# Patient Record
Sex: Male | Born: 1965 | Race: White | Hispanic: No | Marital: Married | Smoking: Never smoker
Health system: Southern US, Community
[De-identification: ages and names within clinical notes are randomized; demographics above are authoritative.]

## PROBLEM LIST (undated history)

## (undated) DIAGNOSIS — E119 Type 2 diabetes mellitus without complications: Secondary | ICD-10-CM

## (undated) DIAGNOSIS — E785 Hyperlipidemia, unspecified: Secondary | ICD-10-CM

## (undated) DIAGNOSIS — I10 Essential (primary) hypertension: Secondary | ICD-10-CM

## (undated) HISTORY — DX: Essential (primary) hypertension: I10

## (undated) HISTORY — DX: Type 2 diabetes mellitus without complications: E11.9

## (undated) HISTORY — DX: Hyperlipidemia, unspecified: E78.5

---
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
## 1989-07-26 HISTORY — PX: COLONOSCOPY: SHX174

## 2016-06-24 DIAGNOSIS — Z0000 Encounter for general adult medical examination without abnormal findings: Secondary | ICD-10-CM | POA: Diagnosis not present

## 2016-06-24 DIAGNOSIS — Z23 Encounter for immunization: Secondary | ICD-10-CM | POA: Diagnosis not present

## 2016-06-24 DIAGNOSIS — Z6841 Body Mass Index (BMI) 40.0 and over, adult: Secondary | ICD-10-CM | POA: Diagnosis not present

## 2016-06-24 DIAGNOSIS — Z1211 Encounter for screening for malignant neoplasm of colon: Secondary | ICD-10-CM | POA: Diagnosis not present

## 2016-09-14 DIAGNOSIS — D3610 Benign neoplasm of peripheral nerves and autonomic nervous system, unspecified: Secondary | ICD-10-CM | POA: Diagnosis not present

## 2016-09-14 DIAGNOSIS — D485 Neoplasm of uncertain behavior of skin: Secondary | ICD-10-CM | POA: Diagnosis not present

## 2017-01-17 DIAGNOSIS — L723 Sebaceous cyst: Secondary | ICD-10-CM | POA: Diagnosis not present

## 2017-01-17 DIAGNOSIS — L03312 Cellulitis of back [any part except buttock]: Secondary | ICD-10-CM | POA: Diagnosis not present

## 2017-01-17 DIAGNOSIS — R030 Elevated blood-pressure reading, without diagnosis of hypertension: Secondary | ICD-10-CM | POA: Diagnosis not present

## 2017-01-27 DIAGNOSIS — I10 Essential (primary) hypertension: Secondary | ICD-10-CM | POA: Diagnosis not present

## 2017-01-27 DIAGNOSIS — L723 Sebaceous cyst: Secondary | ICD-10-CM | POA: Diagnosis not present

## 2017-02-02 DIAGNOSIS — L720 Epidermal cyst: Secondary | ICD-10-CM | POA: Diagnosis not present

## 2017-02-05 DIAGNOSIS — L720 Epidermal cyst: Secondary | ICD-10-CM | POA: Diagnosis not present

## 2017-02-10 DIAGNOSIS — E119 Type 2 diabetes mellitus without complications: Secondary | ICD-10-CM | POA: Diagnosis not present

## 2017-02-10 DIAGNOSIS — R945 Abnormal results of liver function studies: Secondary | ICD-10-CM | POA: Diagnosis not present

## 2017-02-10 DIAGNOSIS — R739 Hyperglycemia, unspecified: Secondary | ICD-10-CM | POA: Diagnosis not present

## 2017-02-10 DIAGNOSIS — I10 Essential (primary) hypertension: Secondary | ICD-10-CM | POA: Diagnosis not present

## 2017-02-11 DIAGNOSIS — Z4802 Encounter for removal of sutures: Secondary | ICD-10-CM | POA: Diagnosis not present

## 2017-02-24 DIAGNOSIS — E7800 Pure hypercholesterolemia, unspecified: Secondary | ICD-10-CM | POA: Diagnosis not present

## 2017-02-24 DIAGNOSIS — I10 Essential (primary) hypertension: Secondary | ICD-10-CM | POA: Diagnosis not present

## 2017-02-24 DIAGNOSIS — E119 Type 2 diabetes mellitus without complications: Secondary | ICD-10-CM | POA: Diagnosis not present

## 2017-02-24 DIAGNOSIS — R945 Abnormal results of liver function studies: Secondary | ICD-10-CM | POA: Diagnosis not present

## 2017-03-10 DIAGNOSIS — E119 Type 2 diabetes mellitus without complications: Secondary | ICD-10-CM | POA: Diagnosis not present

## 2017-03-10 DIAGNOSIS — R05 Cough: Secondary | ICD-10-CM | POA: Diagnosis not present

## 2017-03-10 DIAGNOSIS — I10 Essential (primary) hypertension: Secondary | ICD-10-CM | POA: Diagnosis not present

## 2017-03-10 DIAGNOSIS — R7989 Other specified abnormal findings of blood chemistry: Secondary | ICD-10-CM | POA: Diagnosis not present

## 2017-05-09 DIAGNOSIS — I10 Essential (primary) hypertension: Secondary | ICD-10-CM | POA: Diagnosis not present

## 2017-05-09 DIAGNOSIS — R7989 Other specified abnormal findings of blood chemistry: Secondary | ICD-10-CM | POA: Diagnosis not present

## 2017-05-09 DIAGNOSIS — E119 Type 2 diabetes mellitus without complications: Secondary | ICD-10-CM | POA: Diagnosis not present

## 2017-06-15 DIAGNOSIS — L0390 Cellulitis, unspecified: Secondary | ICD-10-CM | POA: Diagnosis not present

## 2017-06-15 DIAGNOSIS — I10 Essential (primary) hypertension: Secondary | ICD-10-CM | POA: Diagnosis not present

## 2017-06-15 DIAGNOSIS — E119 Type 2 diabetes mellitus without complications: Secondary | ICD-10-CM | POA: Diagnosis not present

## 2017-06-15 DIAGNOSIS — E7800 Pure hypercholesterolemia, unspecified: Secondary | ICD-10-CM | POA: Diagnosis not present

## 2017-06-20 DIAGNOSIS — L720 Epidermal cyst: Secondary | ICD-10-CM | POA: Diagnosis not present

## 2017-06-21 DIAGNOSIS — E0510 Thyrotoxicosis with toxic single thyroid nodule without thyrotoxic crisis or storm: Secondary | ICD-10-CM | POA: Diagnosis not present

## 2017-06-30 DIAGNOSIS — E7800 Pure hypercholesterolemia, unspecified: Secondary | ICD-10-CM | POA: Diagnosis not present

## 2017-06-30 DIAGNOSIS — I10 Essential (primary) hypertension: Secondary | ICD-10-CM | POA: Diagnosis not present

## 2017-06-30 DIAGNOSIS — E119 Type 2 diabetes mellitus without complications: Secondary | ICD-10-CM | POA: Diagnosis not present

## 2017-08-10 DIAGNOSIS — R7989 Other specified abnormal findings of blood chemistry: Secondary | ICD-10-CM | POA: Diagnosis not present

## 2017-08-10 DIAGNOSIS — I10 Essential (primary) hypertension: Secondary | ICD-10-CM | POA: Diagnosis not present

## 2017-08-10 DIAGNOSIS — E119 Type 2 diabetes mellitus without complications: Secondary | ICD-10-CM | POA: Diagnosis not present

## 2017-08-10 DIAGNOSIS — E782 Mixed hyperlipidemia: Secondary | ICD-10-CM | POA: Diagnosis not present

## 2017-10-13 DIAGNOSIS — R718 Other abnormality of red blood cells: Secondary | ICD-10-CM | POA: Diagnosis not present

## 2017-10-13 DIAGNOSIS — E782 Mixed hyperlipidemia: Secondary | ICD-10-CM | POA: Diagnosis not present

## 2017-10-13 DIAGNOSIS — E119 Type 2 diabetes mellitus without complications: Secondary | ICD-10-CM | POA: Diagnosis not present

## 2017-10-13 DIAGNOSIS — I10 Essential (primary) hypertension: Secondary | ICD-10-CM | POA: Diagnosis not present

## 2017-12-05 ENCOUNTER — Ambulatory Visit: Payer: BLUE CROSS/BLUE SHIELD | Admitting: Family Medicine

## 2017-12-05 ENCOUNTER — Encounter: Payer: Self-pay | Admitting: Family Medicine

## 2017-12-05 VITALS — BP 134/86 | HR 80 | Temp 98.5°F | Ht 68.0 in | Wt 349.0 lb

## 2017-12-05 DIAGNOSIS — Z125 Encounter for screening for malignant neoplasm of prostate: Secondary | ICD-10-CM

## 2017-12-05 DIAGNOSIS — Z1211 Encounter for screening for malignant neoplasm of colon: Secondary | ICD-10-CM | POA: Diagnosis not present

## 2017-12-05 DIAGNOSIS — Z0000 Encounter for general adult medical examination without abnormal findings: Secondary | ICD-10-CM

## 2017-12-05 DIAGNOSIS — Z23 Encounter for immunization: Secondary | ICD-10-CM

## 2017-12-05 DIAGNOSIS — Z114 Encounter for screening for human immunodeficiency virus [HIV]: Secondary | ICD-10-CM | POA: Diagnosis not present

## 2017-12-05 LAB — COMPREHENSIVE METABOLIC PANEL
ALBUMIN: 4.3 g/dL (ref 3.5–5.2)
ALK PHOS: 49 U/L (ref 39–117)
ALT: 48 U/L (ref 0–53)
AST: 22 U/L (ref 0–37)
BILIRUBIN TOTAL: 0.5 mg/dL (ref 0.2–1.2)
BUN: 13 mg/dL (ref 6–23)
CO2: 33 mEq/L — ABNORMAL HIGH (ref 19–32)
Calcium: 10.2 mg/dL (ref 8.4–10.5)
Chloride: 100 mEq/L (ref 96–112)
Creatinine, Ser: 0.85 mg/dL (ref 0.40–1.50)
GFR: 100.28 mL/min (ref >60.00)
Glucose, Bld: 159 mg/dL — ABNORMAL HIGH (ref 70–99)
POTASSIUM: 4.5 meq/L (ref 3.5–5.1)
SODIUM: 142 meq/L (ref 135–145)
TOTAL PROTEIN: 7.4 g/dL (ref 6.0–8.3)

## 2017-12-05 LAB — LIPID PANEL
CHOLESTEROL: 175 mg/dL (ref 0–200)
HDL: 46.1 mg/dL (ref >39.00)
LDL Cholesterol: 93 mg/dL (ref 0–99)
NonHDL: 129.16
Total CHOL/HDL Ratio: 4
Triglycerides: 181 mg/dL — ABNORMAL HIGH (ref 0.0–149.0)
VLDL: 36.2 mg/dL (ref 0.0–40.0)

## 2017-12-05 LAB — CBC
HCT: 46.4 % (ref 39.0–52.0)
HEMOGLOBIN: 15.5 g/dL (ref 13.0–17.0)
MCHC: 33.4 g/dL (ref 30.0–36.0)
MCV: 81.9 fl (ref 78.0–100.0)
Platelets: 313 10*3/uL (ref 150.0–400.0)
RBC: 5.66 Mil/uL (ref 4.22–5.81)
RDW: 14.1 % (ref 11.5–15.5)
WBC: 8.8 10*3/uL (ref 4.0–10.5)

## 2017-12-05 LAB — PSA: PSA: 1.08 ng/mL (ref 0.10–4.00)

## 2017-12-05 NOTE — Patient Instructions (Signed)
Give [place] 2-3 business days to get the results of your labs back. If labs are normal, you will likely receive a letter in the mail unless you have MyChart. This can take longer than 2-3 business days.   We will await records from your previous provider.   If you do not hear anything about your referral in the next 1-2 weeks, call our office and ask for an update.  Aim to do some physical exertion for 150 minutes per week. This is typically divided into 5 days per week, 30 minutes per day. The activity should be enough to get your heart rate up. Anything is better than nothing if you have time constraints.  Keep diet as clean as possible.  Make sure to request the pharmacy of choice when you need a refill.  Call your pharmacy to see about the availability of the new shingles vaccine (Shingrix).  Let us know if you need anything.

## 2017-12-05 NOTE — Progress Notes (Signed)
Chief Complaint  Patient presents with  . Establish Care    Well Male Noah Taylor is here for a complete physical.   His last physical was >1 year ago.  Current diet: in general, a "healthy" diet.  Current exercise: tries walking Weight trend: stable Does pt snore? Yes- no apneic episodes or waking up gasping for air. Daytime fatigue? No. Seat belt? Yes.    Health maintenance Shingrix- No Colonoscopy- No Tetanus- Unsure, may have received in Dec, 2017 HIV- No Prostate cancer screening- No  PCV23- no Last A1c within 3-4 mo, <7   Past Medical History:  Diagnosis Date  . Diabetes mellitus without complication ([place])   . Hyperlipidemia   . Hypertension      History reviewed. No pertinent surgical history.  Medications  Current Outpatient Medications on File Prior to Visit  Medication Sig Dispense Refill  . amLODipine (NORVASC) 2.5 MG tablet Take 2.5 mg by mouth daily.    . hydrochlorothiazide (HYDRODIURIL) 25 MG tablet Take 25 mg by mouth daily.    Marland Kitchen losartan (COZAAR) 50 MG tablet Take 50 mg by mouth daily.    . metFORMIN (GLUCOPHAGE) 500 MG tablet Take by mouth 2 (two) times daily with a meal.    . pravastatin (PRAVACHOL) 20 MG tablet Take 20 mg by mouth daily.     Allergies No Known Allergies  Family History Family History  Problem Relation Age of Onset  . Arthritis Mother   . Cancer Mother        ovarian  . Hyperlipidemia Father   . Hypertension Father   . Cancer Father        lung cancer    Review of Systems: Constitutional:  no fevers Eye:  no recent significant change in vision Ear/Nose/Mouth/Throat:  Ears:  no hearing loss Nose/Mouth/Throat:  no complaints of nasal congestion, no sore throat Cardiovascular:  no chest pain, no palpitations Respiratory:  no cough and no shortness of breath Gastrointestinal:  no abdominal pain, no change in bowel habits GU:  Male: negative for dysuria, frequency, and incontinence and negative for prostate  symptoms Musculoskeletal/Extremities:  no pain, redness, or swelling of the joints Integumentary (Skin/Breast):  no abnormal skin lesions reported Neurologic:  no headaches Endocrine: No unexpected weight changes Hematologic/Lymphatic:  no abnormal bleeding  Exam BP 134/86 (BP Location: Left Arm, Patient Position: Sitting, Cuff Size: Large)   Pulse 80   Temp 98.5 F (36.9 C) (Oral)   Ht 5\' 8"  (1.727 m)   Wt (!) 349 lb (158.3 kg)   SpO2 97%   BMI 53.07 kg/m  General:  well developed, well nourished, in no apparent distress Skin:  no significant moles, warts, or growths Head:  no masses, lesions, or tenderness Eyes:  pupils equal and round, sclera anicteric without injection Ears:  canals without lesions, TMs shiny without retraction, no obvious effusion, no erythema Nose:  nares patent, septum midline, mucosa normal Throat/Pharynx:  lips and gingiva without lesion; tongue and uvula midline; non-inflamed pharynx; no exudates or postnasal drainage Neck: neck supple without adenopathy, thyromegaly, or masses Lungs:  clear to auscultation, breath sounds equal bilaterally, no respiratory distress Cardio:  regular rate and rhythm, no LE edema, no bruits Abdomen: Large panniculus, abdomen soft, nontender; bowel sounds normal; no masses or organomegaly Rectal: Deferred Musculoskeletal:  symmetrical muscle groups noted without atrophy or deformity Extremities:  no clubbing, cyanosis, or edema, no deformities, no skin discoloration Neuro:  gait normal; deep tendon reflexes normal and symmetric Psych: well oriented  with normal range of affect and appropriate judgment/insight  Assessment and Plan  Well adult exam - Plan: Lipid panel, Comprehensive metabolic panel, CBC  Screening for HIV (human immunodeficiency virus) - Plan: HIV antibody  Screen for colon cancer - Plan: Ambulatory referral to Gastroenterology  Screening for malignant neoplasm of prostate - Plan: PSA   Well 52 y.o.  male. Counseled on diet and exercise. Counseled on risks and benefits of prostate cancer screening with PSA. The patient agrees to undergo testing. PCV23 today, will await records for tetanus. Contact pharmacy for Shingrix, warned of post 48 hr misery. Immunizations, labs, and further orders as above. Follow up in 6 mo for DM visit pending record review and above results. The patient voiced understanding and agreement to the plan.  [place], DO 12/05/17 9:53 AM

## 2017-12-05 NOTE — Progress Notes (Signed)
Pre visit review using our clinic review tool, if applicable. No additional management support is needed unless otherwise documented below in the visit note. 

## 2017-12-05 NOTE — Addendum Note (Signed)
Addended by: Sharon Seller B on: 12/05/2017 10:03 AM   Modules accepted: Orders

## 2017-12-06 LAB — HIV ANTIBODY (ROUTINE TESTING W REFLEX): HIV 1&2 Ab, 4th Generation: NONREACTIVE

## 2017-12-26 ENCOUNTER — Encounter: Payer: Self-pay | Admitting: Family Medicine

## 2017-12-26 MED ORDER — AMLODIPINE BESYLATE 2.5 MG PO TABS: 2.5000 mg | ORAL_TABLET | Freq: Every day | ORAL | 0 refills | Status: DC

## 2017-12-26 MED ORDER — HYDROCHLOROTHIAZIDE 25 MG PO TABS: 25.0000 mg | ORAL_TABLET | Freq: Every day | ORAL | 0 refills | Status: DC

## 2017-12-26 MED ORDER — METFORMIN HCL 500 MG PO TABS: 500.0000 mg | ORAL_TABLET | Freq: Two times a day (BID) | ORAL | 0 refills | Status: DC

## 2017-12-26 MED ORDER — LOSARTAN POTASSIUM 50 MG PO TABS: 50.0000 mg | ORAL_TABLET | Freq: Every day | ORAL | 0 refills | Status: DC

## 2017-12-26 MED ORDER — PRAVASTATIN SODIUM 20 MG PO TABS: 20.0000 mg | ORAL_TABLET | Freq: Every day | ORAL | 0 refills | Status: DC

## 2018-01-25 ENCOUNTER — Encounter: Payer: Self-pay | Admitting: Gastroenterology

## 2018-02-15 LAB — HM DIABETES EYE EXAM

## 2018-02-20 ENCOUNTER — Encounter: Payer: Self-pay | Admitting: Family Medicine

## 2018-03-07 ENCOUNTER — Telehealth: Payer: Self-pay | Admitting: *Deleted

## 2018-03-07 NOTE — Telephone Encounter (Signed)
Left message that we need to make an OV per Dr Lyndel Safe- left number to return my call

## 2018-03-07 NOTE — Telephone Encounter (Signed)
Let me see this pt  in the office, due to high risk

## 2018-03-07 NOTE — Telephone Encounter (Signed)
Dr Lyndel Safe,  This pt is scheduled for a PV 8-26 and a direct colon 9-9 Monday with you. He had a colon in [place] > 10 yrs ago.  His weight is 349.0lb and he has a BMI of 53.08.  He also has a hx of htn, DM.  Do you want him to have an OV or can he be direct at the hospital   Please advise,  Thanks Lelan Pons

## 2018-03-08 NOTE — Telephone Encounter (Signed)
Patient calling back. Notified patient that Dr.Gupta's first open pv was 9.13.19. Patient states he will call back at later time to schedule.

## 2018-03-28 ENCOUNTER — Other Ambulatory Visit: Payer: Self-pay | Admitting: Family Medicine

## 2018-04-03 ENCOUNTER — Encounter: Payer: BLUE CROSS/BLUE SHIELD | Admitting: Gastroenterology

## 2018-06-05 ENCOUNTER — Encounter: Payer: Self-pay | Admitting: Family Medicine

## 2018-06-05 ENCOUNTER — Ambulatory Visit: Payer: BLUE CROSS/BLUE SHIELD | Admitting: Family Medicine

## 2018-06-05 ENCOUNTER — Other Ambulatory Visit: Payer: Self-pay | Admitting: Family Medicine

## 2018-06-05 VITALS — BP 130/68 | HR 83 | Temp 98.4°F | Ht 68.0 in | Wt 343.2 lb

## 2018-06-05 DIAGNOSIS — E1169 Type 2 diabetes mellitus with other specified complication: Secondary | ICD-10-CM | POA: Insufficient documentation

## 2018-06-05 DIAGNOSIS — E669 Obesity, unspecified: Secondary | ICD-10-CM | POA: Diagnosis not present

## 2018-06-05 DIAGNOSIS — Z23 Encounter for immunization: Secondary | ICD-10-CM | POA: Diagnosis not present

## 2018-06-05 DIAGNOSIS — I10 Essential (primary) hypertension: Secondary | ICD-10-CM | POA: Diagnosis not present

## 2018-06-05 LAB — MICROALBUMIN / CREATININE URINE RATIO
Creatinine,U: 154.1 mg/dL
MICROALB UR: 33.5 mg/dL — AB (ref 0.0–1.9)
Microalb Creat Ratio: 21.7 mg/g (ref 0.0–30.0)

## 2018-06-05 LAB — COMPREHENSIVE METABOLIC PANEL
ALBUMIN: 4.3 g/dL (ref 3.5–5.2)
ALK PHOS: 62 U/L (ref 39–117)
ALT: 78 U/L — ABNORMAL HIGH (ref 0–53)
AST: 33 U/L (ref 0–37)
BUN: 13 mg/dL (ref 6–23)
CALCIUM: 9.5 mg/dL (ref 8.4–10.5)
CHLORIDE: 97 meq/L (ref 96–112)
CO2: 30 mEq/L (ref 19–32)
Creatinine, Ser: 0.88 mg/dL (ref 0.40–1.50)
GFR: 96.16 mL/min (ref >60.00)
Glucose, Bld: 319 mg/dL — ABNORMAL HIGH (ref 70–99)
POTASSIUM: 4.5 meq/L (ref 3.5–5.1)
Sodium: 134 mEq/L — ABNORMAL LOW (ref 135–145)
TOTAL PROTEIN: 7.2 g/dL (ref 6.0–8.3)
Total Bilirubin: 0.5 mg/dL (ref 0.2–1.2)

## 2018-06-05 LAB — LIPID PANEL
CHOL/HDL RATIO: 4
Cholesterol: 190 mg/dL (ref 0–200)
HDL: 44.4 mg/dL (ref >39.00)
LDL CALC: 109 mg/dL — AB (ref 0–99)
NONHDL: 146.02
Triglycerides: 186 mg/dL — ABNORMAL HIGH (ref 0.0–149.0)
VLDL: 37.2 mg/dL (ref 0.0–40.0)

## 2018-06-05 LAB — HEMOGLOBIN A1C: Hgb A1c MFr Bld: 11.1 % — ABNORMAL HIGH (ref 4.6–6.5)

## 2018-06-05 MED ORDER — METFORMIN HCL 1000 MG PO TABS: 1000.0000 mg | ORAL_TABLET | Freq: Two times a day (BID) | ORAL | 3 refills | Status: DC

## 2018-06-05 NOTE — Progress Notes (Signed)
Subjective:   Chief Complaint  Patient presents with  . Follow-up    Noah Taylor is a 52 y.o. male here for follow-up of diabetes.   Noah Taylor's self monitored glucose range is low 100's. Patient denies hypoglycemic reactions. He checks his glucose levels 2 times per week. Patient does not require insulin.   Medications include: Metformin 500 mg bid Exercise: walking Diet: OK Pt is on statin.   Hypertension Patient presents for hypertension follow up. He is compliant with medications- Norvasc, HCTZ, Losartan. Patient has these side effects of medication: none He is sometimes adhering to a healthy diet overall. Exercise: walking  Past Medical History:  Diagnosis Date  . Diabetes mellitus without complication ([place])   . Hyperlipidemia   . Hypertension      Related testing: Date of retinal exam: 01/23/2018 Pneumovax: done Flu Shot: done  Review of Systems: Pulmonary:  No SOB Cardiovascular:  No chest pain  Objective:  BP 130/68 (BP Location: Left Arm, Patient Position: Sitting, Cuff Size: Large)   Pulse 83   Temp 98.4 F (36.9 C) (Oral)   Ht 5\' 8"  (1.727 m)   Wt (!) 343 lb 4 oz (155.7 kg)   SpO2 93%   BMI 52.19 kg/m  General:  Well developed, well nourished, in no apparent distress Skin:  Warm, no pallor or diaphoresis Head:  Normocephalic, atraumatic Eyes:  Pupils equal and round, sclera anicteric without injection  Lungs:  CTAB, no access msc use Cardio:  RRR, no bruits, no LE edema Musculoskeletal:  Symmetrical muscle groups noted without atrophy or deformity Neuro:  Sensation intact to pinprick on feet Psych: Age appropriate judgment and insight  Assessment:   Diabetes mellitus type 2 in obese ([facility]) - Plan: Hemoglobin A1c, Lipid panel, Microalbumin / creatinine urine ratio, Comprehensive metabolic panel, HM DIABETES FOOT EXAM  Essential hypertension  Morbid obesity ([facility])   Plan:   Orders as above. Counseled on diet and exercise. Goal wt loss is 25 lbs in  6 mo. He has lost weight in the past when he sets his mind to it.  Will call GI for colonoscopy after year so his FSA has reset. F/u in 6 mo. The patient voiced understanding and agreement to the plan.  [place], DO 06/05/18 8:13 AM

## 2018-06-05 NOTE — Progress Notes (Signed)
Pre visit review using our clinic review tool, if applicable. No additional management support is needed unless otherwise documented below in the visit note. 

## 2018-06-05 NOTE — Addendum Note (Signed)
Addended by: Sharon Seller B on: 06/05/2018 08:24 AM   Modules accepted: Orders

## 2018-06-05 NOTE — Patient Instructions (Addendum)
Give [place] 2-3 business days to get the results of your labs back.   Goal weight loss: 20-25 lbs.   Aim to do some physical exertion for 150 minutes per week. This is typically divided into 5 days per week, 30 minutes per day. The activity should be enough to get your heart rate up. Anything is better than nothing if you have time constraints.  Clean up the diet.  Let us know if you need anything.

## 2018-06-06 ENCOUNTER — Encounter: Payer: Self-pay | Admitting: Family Medicine

## 2018-07-03 ENCOUNTER — Encounter: Payer: Self-pay | Admitting: Family Medicine

## 2018-07-03 ENCOUNTER — Ambulatory Visit: Payer: BLUE CROSS/BLUE SHIELD | Admitting: Family Medicine

## 2018-07-03 VITALS — BP 122/86 | HR 88 | Temp 98.5°F | Ht 68.5 in | Wt 336.1 lb

## 2018-07-03 DIAGNOSIS — E669 Obesity, unspecified: Secondary | ICD-10-CM | POA: Diagnosis not present

## 2018-07-03 DIAGNOSIS — E1169 Type 2 diabetes mellitus with other specified complication: Secondary | ICD-10-CM | POA: Diagnosis not present

## 2018-07-03 MED ORDER — DAPAGLIFLOZIN PROPANEDIOL 10 MG PO TABS: 10.0000 mg | ORAL_TABLET | Freq: Every day | ORAL | 2 refills | Status: DC

## 2018-07-03 NOTE — Progress Notes (Signed)
Pre visit review using our clinic review tool, if applicable. No additional management support is needed unless otherwise documented below in the visit note. 

## 2018-07-03 NOTE — Patient Instructions (Addendum)
Keep the diet clean and stay active.  Let me know if the medicine is too expensive.   Continue the Metformin and checking your sugars.  There is no data showing that fish oil is helpful with preventing heart disease or living longer.  Let us know if you need anything.

## 2018-07-03 NOTE — Progress Notes (Signed)
Chief Complaint  Patient presents with  . Follow-up    diabetes    Subjective: Patient is a 52 y.o. male here for f/u lab.  Recent A1c was 11.1. He has cleaned up diet tremendously. Walking. Cont to take other meds. Sugars have been in mid-high hundreds. Not on insulin. Metformin was increased from 500 mg bid to 1 g bid.   ROS: Heart: Denies chest pain   Past Medical History:  Diagnosis Date  . Diabetes mellitus without complication ([place])   . Hyperlipidemia   . Hypertension     Objective: BP 122/86 (BP Location: Left Arm, Patient Position: Sitting, Cuff Size: Large)   Pulse 88   Temp 98.5 F (36.9 C) (Oral)   Ht 5' 8.5" (1.74 m)   Wt (!) 336 lb 2 oz (152.5 kg)   SpO2 96%   BMI 50.36 kg/m  General: Awake, appears stated age Heart: RRR, no LE edema Lungs: CTAB, no rales, wheezes or rhonchi. No accessory muscle use Psych: Age appropriate judgment and insight, normal affect and mood  Assessment and Plan: Diabetes mellitus type 2 in obese ([place]) - Plan: dapagliflozin propanediol (FARXIGA) 10 MG TABS tablet  Orders as above. Cont Metformin. Payment card given. Let us know if too expensive. Counseled on diet and exercise, he is doing well. He was offered GLP-1, but preferred PO tab.  F/u in mid Feb. The patient voiced understanding and agreement to the plan.  [place], DO 07/03/18  7:42 AM

## 2018-08-28 ENCOUNTER — Encounter: Payer: Self-pay | Admitting: Family Medicine

## 2018-08-28 DIAGNOSIS — E669 Obesity, unspecified: Principal | ICD-10-CM

## 2018-08-28 DIAGNOSIS — E1169 Type 2 diabetes mellitus with other specified complication: Secondary | ICD-10-CM

## 2018-08-28 MED ORDER — DAPAGLIFLOZIN PROPANEDIOL 10 MG PO TABS: 10.0000 mg | ORAL_TABLET | Freq: Every day | ORAL | 1 refills | Status: DC

## 2018-12-04 ENCOUNTER — Encounter: Payer: Self-pay | Admitting: Family Medicine

## 2018-12-07 ENCOUNTER — Encounter: Payer: BLUE CROSS/BLUE SHIELD | Admitting: Family Medicine

## 2018-12-08 ENCOUNTER — Other Ambulatory Visit: Payer: Self-pay | Admitting: Family Medicine

## 2018-12-08 ENCOUNTER — Encounter: Payer: Self-pay | Admitting: Family Medicine

## 2018-12-08 ENCOUNTER — Other Ambulatory Visit: Payer: Self-pay

## 2018-12-08 ENCOUNTER — Ambulatory Visit (INDEPENDENT_AMBULATORY_CARE_PROVIDER_SITE_OTHER): Payer: BLUE CROSS/BLUE SHIELD | Admitting: Family Medicine

## 2018-12-08 VITALS — BP 108/72 | HR 94 | Temp 98.6°F | Ht 68.0 in | Wt 322.0 lb

## 2018-12-08 DIAGNOSIS — Z0000 Encounter for general adult medical examination without abnormal findings: Secondary | ICD-10-CM | POA: Diagnosis not present

## 2018-12-08 DIAGNOSIS — Z125 Encounter for screening for malignant neoplasm of prostate: Secondary | ICD-10-CM

## 2018-12-08 DIAGNOSIS — E1169 Type 2 diabetes mellitus with other specified complication: Secondary | ICD-10-CM | POA: Diagnosis not present

## 2018-12-08 DIAGNOSIS — R972 Elevated prostate specific antigen [PSA]: Secondary | ICD-10-CM

## 2018-12-08 DIAGNOSIS — E669 Obesity, unspecified: Secondary | ICD-10-CM | POA: Diagnosis not present

## 2018-12-08 DIAGNOSIS — D582 Other hemoglobinopathies: Secondary | ICD-10-CM

## 2018-12-08 LAB — LIPID PANEL
Cholesterol: 177 mg/dL (ref 0–200)
HDL: 41.7 mg/dL (ref >39.00)
LDL Cholesterol: 107 mg/dL — ABNORMAL HIGH (ref 0–99)
NonHDL: 135.2
Total CHOL/HDL Ratio: 4
Triglycerides: 141 mg/dL (ref 0.0–149.0)
VLDL: 28.2 mg/dL (ref 0.0–40.0)

## 2018-12-08 LAB — CBC
HCT: 51.5 % (ref 39.0–52.0)
Hemoglobin: 17.2 g/dL — ABNORMAL HIGH (ref 13.0–17.0)
MCHC: 33.5 g/dL (ref 30.0–36.0)
MCV: 80 fl (ref 78.0–100.0)
Platelets: 332 10*3/uL (ref 150.0–400.0)
RBC: 6.44 Mil/uL — ABNORMAL HIGH (ref 4.22–5.81)
RDW: 14.2 % (ref 11.5–15.5)
WBC: 8.4 10*3/uL (ref 4.0–10.5)

## 2018-12-08 LAB — COMPREHENSIVE METABOLIC PANEL
ALT: 23 U/L (ref 0–53)
AST: 13 U/L (ref 0–37)
Albumin: 4.3 g/dL (ref 3.5–5.2)
Alkaline Phosphatase: 54 U/L (ref 39–117)
BUN: 17 mg/dL (ref 6–23)
CO2: 30 mEq/L (ref 19–32)
Calcium: 9.5 mg/dL (ref 8.4–10.5)
Chloride: 99 mEq/L (ref 96–112)
Creatinine, Ser: 0.96 mg/dL (ref 0.40–1.50)
GFR: 81.67 mL/min (ref >60.00)
Glucose, Bld: 112 mg/dL — ABNORMAL HIGH (ref 70–99)
Potassium: 4 mEq/L (ref 3.5–5.1)
Sodium: 139 mEq/L (ref 135–145)
Total Bilirubin: 0.8 mg/dL (ref 0.2–1.2)
Total Protein: 7.3 g/dL (ref 6.0–8.3)

## 2018-12-08 LAB — PSA: PSA: 1.75 ng/mL (ref 0.10–4.00)

## 2018-12-08 LAB — HEMOGLOBIN A1C: Hgb A1c MFr Bld: 6.4 % (ref 4.6–6.5)

## 2018-12-08 NOTE — Patient Instructions (Signed)
Give [place] 2-3 business days to get the results of your labs back.   Keep the diet clean and stay active.  Schedule your colonoscopy soon.  Let us know if you need anything.

## 2018-12-08 NOTE — Progress Notes (Signed)
Chief Complaint  Patient presents with  . Annual Exam    Well Male Noah Taylor is here for a complete physical.   His last physical was >1 year ago.  Current diet: in general, a "healthy" diet.  Current exercise: walking Weight trend: steadily Daytime fatigue? No. Seat belt? Yes.    Health maintenance Colonoscopy- No - has been referred Tetanus- Yes HIV- Yes Hep C- Yes  Past Medical History:  Diagnosis Date  . Diabetes mellitus without complication ([place])   . Hyperlipidemia   . Hypertension       Past Surgical History:  Procedure Laterality Date  . TONSILLECTOMY AND ADENOIDECTOMY  1969    Medications  Current Outpatient Medications on File Prior to Visit  Medication Sig Dispense Refill  . amLODipine (NORVASC) 2.5 MG tablet TAKE 1 TABLET DAILY 90 tablet 4  . dapagliflozin propanediol (FARXIGA) 10 MG TABS tablet Take 10 mg by mouth daily. 90 tablet 1  . hydrochlorothiazide (HYDRODIURIL) 25 MG tablet TAKE 1 TABLET DAILY 90 tablet 4  . losartan (COZAAR) 50 MG tablet TAKE 1 TABLET DAILY 90 tablet 4  . metFORMIN (GLUCOPHAGE) 1000 MG tablet Take 1 tablet (1,000 mg total) by mouth 2 (two) times daily with a meal. 180 tablet 3  . pravastatin (PRAVACHOL) 20 MG tablet TAKE 1 TABLET DAILY 90 tablet 4   Allergies No Known Allergies  Family History Family History  Problem Relation Age of Onset  . Arthritis Mother   . Cancer Mother        ovarian  . Hyperlipidemia Father   . Hypertension Father   . Cancer Father        lung cancer    Review of Systems: Constitutional:  no fevers Eye:  no recent significant change in vision Ear/Nose/Mouth/Throat:  Ears:  no hearing loss Nose/Mouth/Throat:  no complaints of nasal congestion, no sore throat Cardiovascular:  no chest pain, no palpitations Respiratory:  no cough and no shortness of breath Gastrointestinal:  no abdominal pain, no change in bowel habits GU:  Male: negative for dysuria, frequency, and incontinence and  negative for prostate symptoms Musculoskeletal/Extremities:  no pain, redness, or swelling of the joints Integumentary (Skin/Breast):  no abnormal skin lesions reported Neurologic:  no headaches Endocrine: No unexpected weight changes Hematologic/Lymphatic:  no abnormal bleeding  Exam BP 108/72 (BP Location: Left Arm, Patient Position: Sitting, Cuff Size: Large)   Pulse 94   Temp 98.6 F (37 C) (Oral)   Ht 5\' 8"  (1.727 m)   Wt (!) 322 lb (146.1 kg)   SpO2 96%   BMI 48.96 kg/m  General:  well developed, well nourished, in no apparent distress Skin:  no significant moles, warts, or growths Head:  no masses, lesions, or tenderness Eyes:  pupils equal and round, sclera anicteric without injection Ears:  canals without lesions, TMs shiny without retraction, no obvious effusion, no erythema Nose:  nares patent, septum midline, mucosa normal Throat/Pharynx:  lips and gingiva without lesion; tongue and uvula midline; non-inflamed pharynx; no exudates or postnasal drainage Neck: neck supple without adenopathy, thyromegaly, or masses Lungs:  clear to auscultation, breath sounds equal bilaterally, no respiratory distress Cardio:  regular rate and rhythm, no LE edema, no bruits Abdomen:  abdomen soft, nontender; bowel sounds normal; no masses or organomegaly Rectal: Deferred Musculoskeletal:  symmetrical muscle groups noted without atrophy or deformity Extremities:  no clubbing, cyanosis, or edema, no deformities, no skin discoloration Neuro:  gait normal; deep tendon reflexes normal and symmetric Psych: well  oriented with normal range of affect and appropriate judgment/insight  Assessment and Plan  Well adult exam - Plan: CBC, Comprehensive metabolic panel, Lipid panel  Diabetes mellitus type 2 in obese ([facility]) - Plan: Hemoglobin A1c  Screening for prostate cancer - Plan: PSA   Well 53 y.o. male. Counseled on diet and exercise. Counseled on risks and benefits of prostate cancer  screening with PSA. The patient agrees to undergo testing. Call for colonoscopy, he knows he needs to schedule but due to pandemic had not been able to.  Immunizations, labs, and further orders as above. Follow up in 6 mo pending above. The patient voiced understanding and agreement to the plan.  [place], DO 12/08/18 8:15 AM

## 2019-01-19 ENCOUNTER — Other Ambulatory Visit (INDEPENDENT_AMBULATORY_CARE_PROVIDER_SITE_OTHER): Payer: BC Managed Care – PPO

## 2019-01-19 ENCOUNTER — Other Ambulatory Visit: Payer: Self-pay | Admitting: Family Medicine

## 2019-01-19 ENCOUNTER — Other Ambulatory Visit: Payer: Self-pay

## 2019-01-19 DIAGNOSIS — R972 Elevated prostate specific antigen [PSA]: Secondary | ICD-10-CM | POA: Diagnosis not present

## 2019-01-19 DIAGNOSIS — D582 Other hemoglobinopathies: Secondary | ICD-10-CM | POA: Diagnosis not present

## 2019-01-19 LAB — CBC
HCT: 50.2 % (ref 39.0–52.0)
Hemoglobin: 16.4 g/dL (ref 13.0–17.0)
MCHC: 32.6 g/dL (ref 30.0–36.0)
MCV: 80.9 fl (ref 78.0–100.0)
Platelets: 341 10*3/uL (ref 150.0–400.0)
RBC: 6.2 Mil/uL — ABNORMAL HIGH (ref 4.22–5.81)
RDW: 14.7 % (ref 11.5–15.5)
WBC: 9.2 10*3/uL (ref 4.0–10.5)

## 2019-01-19 LAB — PSA: PSA: 1.24 ng/mL (ref 0.10–4.00)

## 2019-02-06 ENCOUNTER — Encounter: Payer: Self-pay | Admitting: Family Medicine

## 2019-02-06 DIAGNOSIS — E1169 Type 2 diabetes mellitus with other specified complication: Secondary | ICD-10-CM

## 2019-02-06 MED ORDER — DAPAGLIFLOZIN PROPANEDIOL 10 MG PO TABS: 10.0000 mg | ORAL_TABLET | Freq: Every day | ORAL | 1 refills | Status: DC

## 2019-02-16 LAB — HM DIABETES EYE EXAM

## 2019-02-26 ENCOUNTER — Encounter: Payer: Self-pay | Admitting: Family Medicine

## 2019-02-28 ENCOUNTER — Ambulatory Visit (INDEPENDENT_AMBULATORY_CARE_PROVIDER_SITE_OTHER): Payer: BC Managed Care – PPO | Admitting: Family Medicine

## 2019-02-28 ENCOUNTER — Encounter: Payer: Self-pay | Admitting: Family Medicine

## 2019-02-28 ENCOUNTER — Other Ambulatory Visit: Payer: Self-pay

## 2019-02-28 DIAGNOSIS — J0110 Acute frontal sinusitis, unspecified: Secondary | ICD-10-CM

## 2019-02-28 MED ORDER — PREDNISONE 20 MG PO TABS: 40.0000 mg | ORAL_TABLET | Freq: Every day | ORAL | 0 refills | Status: AC

## 2019-02-28 MED ORDER — AMOXICILLIN-POT CLAVULANATE 875-125 MG PO TABS: 1.0000 | ORAL_TABLET | Freq: Two times a day (BID) | ORAL | 0 refills | Status: DC

## 2019-02-28 NOTE — Progress Notes (Signed)
Chief Complaint  Patient presents with  . Sore Throat    congestion  . Fever    Shearon Balo here for URI complaints. Due to COVID-19 pandemic, we are interacting via web portal for an electronic face-to-face visit. I verified patient's ID using 2 identifiers. Patient agreed to proceed with visit via this method. Patient is at home, I am at office. Patient and I are present for visit.   Duration: 2 days  Associated symptoms: sinus headache, sinus congestion, sinus pain, rhinorrhea, sore throat and cough (from drainage) and fever (99.75F) Denies: itchy watery eyes, ear pain, ear drainage, wheezing, shortness of breath, chest pain, myalgia and digestive s/s Treatment to date: Mucinex DM, Motrin Sick contacts: Yes - son  ROS:  Const: +fevers HEENT: As noted in HPI Lungs: No SOB  Past Medical History:  Diagnosis Date  . Diabetes mellitus without complication ([place])   . Hyperlipidemia   . Hypertension    Exam No conversational dyspnea Age appropriate judgment and insight Nml affect and mood  Acute non-recurrent frontal sinusitis - Plan: predniSONE (DELTASONE) 20 MG tablet, amoxicillin-clavulanate (AUGMENTIN) 875-125 MG tablet, pocket rx, take abx in 2 d if no better.  Orders as above. Continue to push fluids, practice good hand hygiene, cover mouth when coughing. F/u prn. If starting to experience fevers, shaking, or shortness of breath, seek immediate care. Pt voiced understanding and agreement to the plan.  [place], DO 02/28/19 3:10 PM

## 2019-03-23 ENCOUNTER — Other Ambulatory Visit: Payer: Self-pay

## 2019-03-23 ENCOUNTER — Other Ambulatory Visit (INDEPENDENT_AMBULATORY_CARE_PROVIDER_SITE_OTHER): Payer: BC Managed Care – PPO

## 2019-03-23 DIAGNOSIS — D582 Other hemoglobinopathies: Secondary | ICD-10-CM

## 2019-03-23 DIAGNOSIS — R972 Elevated prostate specific antigen [PSA]: Secondary | ICD-10-CM

## 2019-03-23 LAB — PSA: PSA: 1.32 ng/mL (ref 0.10–4.00)

## 2019-03-23 LAB — CBC
HCT: 49.7 % (ref 39.0–52.0)
Hemoglobin: 16.6 g/dL (ref 13.0–17.0)
MCHC: 33.3 g/dL (ref 30.0–36.0)
MCV: 80.6 fl (ref 78.0–100.0)
Platelets: 360 10*3/uL (ref 150.0–400.0)
RBC: 6.17 Mil/uL — ABNORMAL HIGH (ref 4.22–5.81)
RDW: 14.3 % (ref 11.5–15.5)
WBC: 8.9 10*3/uL (ref 4.0–10.5)

## 2019-05-24 ENCOUNTER — Other Ambulatory Visit: Payer: Self-pay | Admitting: Family Medicine

## 2019-06-12 ENCOUNTER — Ambulatory Visit: Payer: BLUE CROSS/BLUE SHIELD | Admitting: Family Medicine

## 2019-06-15 ENCOUNTER — Encounter: Payer: Self-pay | Admitting: Family Medicine

## 2019-06-15 ENCOUNTER — Ambulatory Visit: Payer: BC Managed Care – PPO | Admitting: Family Medicine

## 2019-06-15 ENCOUNTER — Other Ambulatory Visit: Payer: Self-pay

## 2019-06-15 VITALS — BP 114/74 | HR 80 | Temp 96.4°F | Ht 68.5 in | Wt 326.4 lb

## 2019-06-15 DIAGNOSIS — Z23 Encounter for immunization: Secondary | ICD-10-CM

## 2019-06-15 DIAGNOSIS — E1169 Type 2 diabetes mellitus with other specified complication: Secondary | ICD-10-CM | POA: Diagnosis not present

## 2019-06-15 DIAGNOSIS — I10 Essential (primary) hypertension: Secondary | ICD-10-CM

## 2019-06-15 DIAGNOSIS — B353 Tinea pedis: Secondary | ICD-10-CM

## 2019-06-15 DIAGNOSIS — E669 Obesity, unspecified: Secondary | ICD-10-CM | POA: Diagnosis not present

## 2019-06-15 LAB — COMPREHENSIVE METABOLIC PANEL
ALT: 33 U/L (ref 0–53)
AST: 17 U/L (ref 0–37)
Albumin: 4.5 g/dL (ref 3.5–5.2)
Alkaline Phosphatase: 55 U/L (ref 39–117)
BUN: 14 mg/dL (ref 6–23)
CO2: 30 mEq/L (ref 19–32)
Calcium: 9.8 mg/dL (ref 8.4–10.5)
Chloride: 97 mEq/L (ref 96–112)
Creatinine, Ser: 0.98 mg/dL (ref 0.40–1.50)
GFR: 79.6 mL/min (ref >60.00)
Glucose, Bld: 115 mg/dL — ABNORMAL HIGH (ref 70–99)
Potassium: 4.4 mEq/L (ref 3.5–5.1)
Sodium: 135 mEq/L (ref 135–145)
Total Bilirubin: 0.8 mg/dL (ref 0.2–1.2)
Total Protein: 7.2 g/dL (ref 6.0–8.3)

## 2019-06-15 LAB — MICROALBUMIN / CREATININE URINE RATIO
Creatinine,U: 97.3 mg/dL
Microalb Creat Ratio: 1.7 mg/g (ref 0.0–30.0)
Microalb, Ur: 1.7 mg/dL (ref 0.0–1.9)

## 2019-06-15 LAB — LIPID PANEL
Cholesterol: 176 mg/dL (ref 0–200)
HDL: 42.7 mg/dL (ref >39.00)
LDL Cholesterol: 100 mg/dL — ABNORMAL HIGH (ref 0–99)
NonHDL: 133.45
Total CHOL/HDL Ratio: 4
Triglycerides: 165 mg/dL — ABNORMAL HIGH (ref 0.0–149.0)
VLDL: 33 mg/dL (ref 0.0–40.0)

## 2019-06-15 LAB — HEMOGLOBIN A1C: Hgb A1c MFr Bld: 6.4 % (ref 4.6–6.5)

## 2019-06-15 MED ORDER — LOSARTAN POTASSIUM 50 MG PO TABS: 50.0000 mg | ORAL_TABLET | Freq: Every day | ORAL | 4 refills | Status: DC

## 2019-06-15 MED ORDER — AMLODIPINE BESYLATE 2.5 MG PO TABS: 2.5000 mg | ORAL_TABLET | Freq: Every day | ORAL | 4 refills | Status: DC

## 2019-06-15 MED ORDER — KETOCONAZOLE 2 % EX CREA: 1.0000 "application " | TOPICAL_CREAM | Freq: Every day | CUTANEOUS | 0 refills | Status: DC

## 2019-06-15 MED ORDER — DAPAGLIFLOZIN PROPANEDIOL 10 MG PO TABS: 10.0000 mg | ORAL_TABLET | Freq: Every day | ORAL | 2 refills | Status: DC

## 2019-06-15 MED ORDER — PRAVASTATIN SODIUM 20 MG PO TABS: 20.0000 mg | ORAL_TABLET | Freq: Every day | ORAL | 4 refills | Status: DC

## 2019-06-15 MED ORDER — HYDROCHLOROTHIAZIDE 25 MG PO TABS: 25.0000 mg | ORAL_TABLET | Freq: Every day | ORAL | 4 refills | Status: DC

## 2019-06-15 NOTE — Patient Instructions (Signed)
Give [place] 2-3 business days to get the results of your labs back.   Keep the feet as dry as possible.  Keep the diet clean and stay active.  Aim to do some physical exertion for 150 minutes per week. This is typically divided into 5 days per week, 30 minutes per day. The activity should be enough to get your heart rate up. Anything is better than nothing if you have time constraints.  Goal weight in 6 mo: 305-310 lbs  Let us know if you need anything.

## 2019-06-15 NOTE — Progress Notes (Signed)
Subjective:   Chief Complaint  Patient presents with  . Follow-up    Noah Taylor is a 53 y.o. male here for follow-up of diabetes.   Lean does not routine Medications include: Metformin 1000 mg bid, Farxiga 10 mg/d Diet is fair, he does enjoy sweets Exercise: none   Hypertension Patient presents for hypertension follow up. He does not monitor home blood pressures. He is compliant with medications.- Norvasc 2.5 mg/d, HCTZ 25 mg/d, losartan 50 mg/d Patient has these side effects of medication: none He is usually adhering to a healthy diet overall. Exercise: none  Past Medical History:  Diagnosis Date  . Diabetes mellitus without complication ([place])   . Hyperlipidemia   . Hypertension      Related testing: Date of retinal exam: Done Pneumovax: done Flu Shot: done  Review of Systems: Pulmonary:  No SOB Cardiovascular:  No chest pain  Objective:  BP 114/74 (BP Location: Left Arm, Patient Position: Sitting, Cuff Size: Large)   Pulse 80   Temp (!) 96.4 F (35.8 C) (Temporal)   Ht 5' 8.5" (1.74 m)   Wt (!) 326 lb 6 oz (148 kg)   SpO2 97%   BMI 48.90 kg/m  General:  Well developed, well nourished, in no apparent distress Skin:  Warm, no pallor or diaphoresis Head:  Normocephalic, atraumatic Eyes:  Pupils equal and round, sclera anicteric without injection  Lungs:  CTAB, no access msc use Cardio:  RRR, no bruits, no LE edema Musculoskeletal:  Symmetrical muscle groups noted without atrophy or deformity Neuro:  Sensation intact to pinprick on feet Psych: Age appropriate judgment and insight  Assessment:   Diabetes mellitus type 2 in obese ([facility]) - Plan: Comp Met (CMET), Lipid Profile, HgB A1c, Urine Microalbumin w/creat. ratio, pravastatin (PRAVACHOL) 20 MG tablet, dapagliflozin propanediol (FARXIGA) 10 MG TABS tablet  Essential hypertension - Plan: amLODipine (NORVASC) 2.5 MG tablet, losartan (COZAAR) 50 MG tablet, hydrochlorothiazide (HYDRODIURIL) 25 MG  tablet  Tinea pedis of both feet - Plan: ketoconazole (NIZORAL) 2 % cream  Need for influenza vaccination - Plan: Flu Vaccine QUAD 6+ mos PF IM (Fluarix Quad PF)   Plan:   Orders as above. Counseled on diet and exercise. F/u in 6 mo. The patient voiced understanding and agreement to the plan.  [place], DO 06/15/19 7:54 AM

## 2019-12-05 ENCOUNTER — Telehealth: Payer: Self-pay | Admitting: Family Medicine

## 2019-12-05 MED ORDER — METFORMIN HCL 1000 MG PO TABS: ORAL_TABLET | ORAL | 1 refills | Status: DC

## 2019-12-05 NOTE — Telephone Encounter (Signed)
° ° °  metFORMIN (GLUCOPHAGE) 1000 MG tablet [id number]   Patient hasn't called pharmacy.   [id number] - [place], [place] - [address] AT [address]  [address], [place] [address]  Phone:  [phone] Fax:  [phone]  DEA #:  [id number]

## 2019-12-05 NOTE — Telephone Encounter (Signed)
Refill done.  

## 2019-12-14 ENCOUNTER — Encounter: Payer: BC Managed Care – PPO | Admitting: Family Medicine

## 2019-12-19 ENCOUNTER — Ambulatory Visit: Payer: BC Managed Care – PPO | Admitting: Family Medicine

## 2020-01-01 ENCOUNTER — Encounter: Payer: Self-pay | Admitting: Family Medicine

## 2020-01-01 ENCOUNTER — Other Ambulatory Visit: Payer: Self-pay | Admitting: Family Medicine

## 2020-01-01 ENCOUNTER — Other Ambulatory Visit: Payer: Self-pay

## 2020-01-01 ENCOUNTER — Ambulatory Visit: Payer: BC Managed Care – PPO | Admitting: Family Medicine

## 2020-01-01 VITALS — BP 122/78 | HR 94 | Temp 95.7°F | Ht 68.5 in | Wt 328.2 lb

## 2020-01-01 DIAGNOSIS — N63 Unspecified lump in unspecified breast: Secondary | ICD-10-CM

## 2020-01-01 DIAGNOSIS — Z0000 Encounter for general adult medical examination without abnormal findings: Secondary | ICD-10-CM | POA: Diagnosis not present

## 2020-01-01 DIAGNOSIS — Z125 Encounter for screening for malignant neoplasm of prostate: Secondary | ICD-10-CM

## 2020-01-01 DIAGNOSIS — Z1211 Encounter for screening for malignant neoplasm of colon: Secondary | ICD-10-CM | POA: Diagnosis not present

## 2020-01-01 DIAGNOSIS — R972 Elevated prostate specific antigen [PSA]: Secondary | ICD-10-CM

## 2020-01-01 LAB — COMPREHENSIVE METABOLIC PANEL
ALT: 33 U/L (ref 0–53)
AST: 17 U/L (ref 0–37)
Albumin: 4.6 g/dL (ref 3.5–5.2)
Alkaline Phosphatase: 56 U/L (ref 39–117)
BUN: 16 mg/dL (ref 6–23)
CO2: 29 mEq/L (ref 19–32)
Calcium: 9.7 mg/dL (ref 8.4–10.5)
Chloride: 98 mEq/L (ref 96–112)
Creatinine, Ser: 0.9 mg/dL (ref 0.40–1.50)
GFR: 87.64 mL/min (ref >60.00)
Glucose, Bld: 102 mg/dL — ABNORMAL HIGH (ref 70–99)
Potassium: 4.2 mEq/L (ref 3.5–5.1)
Sodium: 135 mEq/L (ref 135–145)
Total Bilirubin: 0.6 mg/dL (ref 0.2–1.2)
Total Protein: 7.3 g/dL (ref 6.0–8.3)

## 2020-01-01 LAB — LIPID PANEL
Cholesterol: 182 mg/dL (ref 0–200)
HDL: 45.6 mg/dL (ref >39.00)
LDL Cholesterol: 107 mg/dL — ABNORMAL HIGH (ref 0–99)
NonHDL: 136.3
Total CHOL/HDL Ratio: 4
Triglycerides: 145 mg/dL (ref 0.0–149.0)
VLDL: 29 mg/dL (ref 0.0–40.0)

## 2020-01-01 LAB — CBC
HCT: 51.3 % (ref 39.0–52.0)
Hemoglobin: 17 g/dL (ref 13.0–17.0)
MCHC: 33.1 g/dL (ref 30.0–36.0)
MCV: 80.9 fl (ref 78.0–100.0)
Platelets: 351 10*3/uL (ref 150.0–400.0)
RBC: 6.34 Mil/uL — ABNORMAL HIGH (ref 4.22–5.81)
RDW: 14.1 % (ref 11.5–15.5)
WBC: 9.9 10*3/uL (ref 4.0–10.5)

## 2020-01-01 LAB — HEMOGLOBIN A1C: Hgb A1c MFr Bld: 6.7 % — ABNORMAL HIGH (ref 4.6–6.5)

## 2020-01-01 LAB — PSA: PSA: 2.62 ng/mL (ref 0.10–4.00)

## 2020-01-01 NOTE — Progress Notes (Signed)
Chief Complaint  Patient presents with  . Follow-up    Well Male Noah Taylor is here for a complete physical.   His last physical was >1 year ago.  Current diet: in general, a "healthy" diet.  Current exercise: walking, some wt resistance Weight trend: has gained a few lbs Daytime fatigue? No.  Seat belt? Yes.    Health maintenance Shingrix- no Colonoscopy- No Tetanus- Yes HIV- Yes  Past Medical History:  Diagnosis Date  . Diabetes mellitus without complication ([place])   . Hyperlipidemia   . Hypertension       Past Surgical History:  Procedure Laterality Date  . TONSILLECTOMY AND ADENOIDECTOMY  1969    Medications  Current Outpatient Medications on File Prior to Visit  Medication Sig Dispense Refill  . amLODipine (NORVASC) 2.5 MG tablet Take 1 tablet (2.5 mg total) by mouth daily. 90 tablet 4  . dapagliflozin propanediol (FARXIGA) 10 MG TABS tablet Take 10 mg by mouth daily. 90 tablet 2  . hydrochlorothiazide (HYDRODIURIL) 25 MG tablet Take 1 tablet (25 mg total) by mouth daily. 90 tablet 4  . losartan (COZAAR) 50 MG tablet Take 1 tablet (50 mg total) by mouth daily. 90 tablet 4  . metFORMIN (GLUCOPHAGE) 1000 MG tablet TAKE 1 TABLET(1000 MG) BY MOUTH TWICE DAILY WITH A MEAL 180 tablet 1  . pravastatin (PRAVACHOL) 20 MG tablet Take 1 tablet (20 mg total) by mouth daily. 90 tablet 4    Allergies No Known Allergies  Family History Family History  Problem Relation Age of Onset  . Arthritis Mother   . Cancer Mother        ovarian  . Hyperlipidemia Father   . Hypertension Father   . Cancer Father        lung cancer    Review of Systems: Constitutional:  no fevers Eye:  no recent significant change in vision Ear/Nose/Mouth/Throat:  Ears:  no hearing loss Nose/Mouth/Throat:  no complaints of nasal congestion, no sore throat Cardiovascular:  no chest pain, no palpitations Respiratory:  no cough and no shortness of breath Gastrointestinal:  no abdominal pain, no  change in bowel habits GU:  Male: negative for dysuria, frequency, and incontinence and negative for prostate symptoms Musculoskeletal/Extremities:  no pain, redness, or swelling of the joints Integumentary (Skin/Breast): +tender lump under R nipple; otherwise no abnormal skin lesions reported Neurologic:  no headaches Endocrine: No unexpected weight changes Hematologic/Lymphatic:  no abnormal bleeding  Exam BP 122/78 (BP Location: Left Arm, Patient Position: Sitting, Cuff Size: Large)   Pulse 94   Temp (!) 95.7 F (35.4 C) (Temporal)   Ht 5' 8.5" (1.74 m)   Wt (!) 328 lb 4 oz (148.9 kg)   SpO2 95%   BMI 49.18 kg/m  General:  well developed, well nourished, in no apparent distress Skin: circular and hard lesion that is moveable under R nipple, measuring 3 cm in diameter, +ttp, no fluctuance, erythema, lymphadenopathy or drainage; otherwise no significant moles, warts, or growths Head:  no masses, lesions, or tenderness Eyes:  pupils equal and round, sclera anicteric without injection Ears:  canals without lesions, TMs shiny without retraction, no obvious effusion, no erythema Nose:  nares patent, septum midline, mucosa normal Throat/Pharynx:  lips and gingiva without lesion; tongue and uvula midline; non-inflamed pharynx; no exudates or postnasal drainage Neck: neck supple without adenopathy, thyromegaly, or masses Cardiac: RRR, no bruits, no LE edema Lungs:  clear to auscultation, breath sounds equal bilaterally, no respiratory distress Rectal: Deferred Musculoskeletal:  symmetrical muscle groups noted without atrophy or deformity Neuro:  gait normal; deep tendon reflexes normal and symmetric Psych: well oriented with normal range of affect and appropriate judgment/insight  Assessment and Plan  Well adult exam - Plan: CBC, Comprehensive metabolic panel, Hemoglobin A1c, Lipid panel  Screen for colon cancer - Plan: Ambulatory referral to Gastroenterology  Screening for prostate  cancer - Plan: PSA  Breast lump - Plan: US BREAST LTD UNI RIGHT INC AXILLA   Well 54 y.o. male. Counseled on diet and exercise. Counseled on risks and benefits of prostate cancer screening with PSA. The patient agrees to undergo testing. Likely inflamed gynecomastia, will see what US shows.  Immunizations, labs, and further orders as above. Follow up in 6 mo or prn. The patient voiced understanding and agreement to the plan.  [place], DO 01/01/20 9:01 AM

## 2020-01-01 NOTE — Progress Notes (Signed)
psa

## 2020-01-01 NOTE — Patient Instructions (Addendum)
Give [place] 2-3 business days to get the results of your labs back.   Keep the diet clean and stay active.   The new Shingrix vaccine (for shingles) is a 2 shot series. It can make people feel low energy, achy and almost like they have the flu for 48 hours after injection. Please plan accordingly when deciding on when to get this shot. Call our office for a nurse visit appointment to get this. The second shot of the series is less severe regarding the side effects, but it still lasts 48 hours.   If you do not hear anything about your referral in the next 1-2 weeks, call our office and ask for an update.  Someone will reach out regarding your ultrasound.   Let us know if you need anything.

## 2020-01-02 ENCOUNTER — Encounter: Payer: Self-pay | Admitting: Gastroenterology

## 2020-01-14 ENCOUNTER — Ambulatory Visit
Admission: RE | Admit: 2020-01-14 | Discharge: 2020-01-14 | Disposition: A | Discharge status: Home / Self Care | Payer: BC Managed Care – PPO | Source: Ambulatory Visit | Attending: Family Medicine | Admitting: Family Medicine

## 2020-01-14 ENCOUNTER — Other Ambulatory Visit: Payer: Self-pay

## 2020-01-14 ENCOUNTER — Ambulatory Visit: Payer: BC Managed Care – PPO

## 2020-01-14 DIAGNOSIS — R928 Other abnormal and inconclusive findings on diagnostic imaging of breast: Secondary | ICD-10-CM | POA: Diagnosis not present

## 2020-01-14 DIAGNOSIS — N63 Unspecified lump in unspecified breast: Secondary | ICD-10-CM

## 2020-02-01 ENCOUNTER — Other Ambulatory Visit: Payer: Self-pay

## 2020-02-01 ENCOUNTER — Ambulatory Visit (AMBULATORY_SURGERY_CENTER): Payer: Self-pay

## 2020-02-01 VITALS — Ht 68.5 in | Wt 333.0 lb

## 2020-02-01 DIAGNOSIS — Z1211 Encounter for screening for malignant neoplasm of colon: Secondary | ICD-10-CM

## 2020-02-01 MED ORDER — NA SULFATE-K SULFATE-MG SULF 17.5-3.13-1.6 GM/177ML PO SOLN
1.0000 | Freq: Once | ORAL | 0 refills | Status: AC
Start: 2020-02-01 — End: 2020-02-01

## 2020-02-01 NOTE — Progress Notes (Signed)
No egg or soy allergy known to patient  No issues with past sedation with any surgeries  or procedures, no intubation problems  No diet pills per patient No home 02 use per patient  No blood thinners per patient  Pt denies issues with constipation  No A fib or A flutter  EMMI video sent to pt's e mail  COVID 19 guidelines implemented in [place] today   Pt vaccinated for covid  Due to the COVID-19 pandemic we are asking patients to follow these guidelines. Please only bring one care partner. Please be aware that your care partner may wait in the car in the parking lot or if they feel like they will be too hot to wait in the car, they may wait in the lobby on the 4th floor. All care partners are required to wear a mask the entire time (we do not have any that we can provide them), they need to practice social distancing, and we will do a Covid check for all patient's and care partners when you arrive. Also we will check their temperature and your temperature. If the care partner waits in their car they need to stay in the parking lot the entire time and we will call them on their cell phone when the patient is ready for discharge so they can bring the car to the front of the building. Also all patient's will need to wear a mask into building.

## 2020-02-05 ENCOUNTER — Encounter: Payer: Self-pay | Admitting: Gastroenterology

## 2020-02-12 ENCOUNTER — Other Ambulatory Visit: Payer: Self-pay

## 2020-02-12 ENCOUNTER — Other Ambulatory Visit: Payer: Self-pay | Admitting: Family Medicine

## 2020-02-12 ENCOUNTER — Other Ambulatory Visit (INDEPENDENT_AMBULATORY_CARE_PROVIDER_SITE_OTHER): Payer: BC Managed Care – PPO

## 2020-02-12 DIAGNOSIS — R972 Elevated prostate specific antigen [PSA]: Secondary | ICD-10-CM

## 2020-02-12 LAB — PSA: PSA: 3.07 ng/mL (ref 0.10–4.00)

## 2020-02-15 ENCOUNTER — Ambulatory Visit (AMBULATORY_SURGERY_CENTER): Payer: BC Managed Care – PPO | Admitting: Gastroenterology

## 2020-02-15 ENCOUNTER — Encounter: Payer: Self-pay | Admitting: Gastroenterology

## 2020-02-15 ENCOUNTER — Other Ambulatory Visit: Payer: Self-pay

## 2020-02-15 VITALS — BP 141/87 | HR 71 | Temp 98.7°F | Resp 15 | Ht 68.0 in | Wt 333.0 lb

## 2020-02-15 DIAGNOSIS — D120 Benign neoplasm of cecum: Secondary | ICD-10-CM | POA: Diagnosis not present

## 2020-02-15 DIAGNOSIS — D128 Benign neoplasm of rectum: Secondary | ICD-10-CM

## 2020-02-15 DIAGNOSIS — Z1211 Encounter for screening for malignant neoplasm of colon: Secondary | ICD-10-CM

## 2020-02-15 DIAGNOSIS — D122 Benign neoplasm of ascending colon: Secondary | ICD-10-CM | POA: Diagnosis not present

## 2020-02-15 DIAGNOSIS — D127 Benign neoplasm of rectosigmoid junction: Secondary | ICD-10-CM

## 2020-02-15 DIAGNOSIS — K5730 Diverticulosis of large intestine without perforation or abscess without bleeding: Secondary | ICD-10-CM

## 2020-02-15 DIAGNOSIS — K640 First degree hemorrhoids: Secondary | ICD-10-CM

## 2020-02-15 DIAGNOSIS — D129 Benign neoplasm of anus and anal canal: Secondary | ICD-10-CM

## 2020-02-15 MED ORDER — SODIUM CHLORIDE 0.9 % IV SOLN
500.0000 mL | Freq: Once | INTRAVENOUS | Status: DC
Start: 2020-02-15 — End: 2020-02-15

## 2020-02-15 NOTE — Op Note (Signed)
[place] Patient Name: Noah Taylor Procedure Date: 02/15/2020 8:00 AM MRN: [id number] Endoscopist: Gerrit Heck , MD Age: 54 Referring MD:  Date of Birth: 04-21-1966 Gender: Male Account #: [id number] Procedure:                Colonoscopy Indications:              Screening for colorectal malignant neoplasm (last                            colonoscopy was more than 20 years ago) Medicines:                Monitored Anesthesia Care Procedure:                Pre-Anesthesia Assessment:                           - Prior to the procedure, a History and Physical                            was performed, and patient medications and                            allergies were reviewed. The patient's tolerance of                            previous anesthesia was also reviewed. The risks                            and benefits of the procedure and the sedation                            options and risks were discussed with the patient.                            All questions were answered, and informed consent                            was obtained. Prior Anticoagulants: The patient has                            taken no previous anticoagulant or antiplatelet                            agents. ASA Grade Assessment: III - A patient with                            severe systemic disease. After reviewing the risks                            and benefits, the patient was deemed in                            satisfactory condition to undergo the procedure.  After obtaining informed consent, the colonoscope                            was passed under direct vision. Throughout the                            procedure, the patient's blood pressure, pulse, and                            oxygen saturations were monitored continuously. The                            Colonoscope was introduced through the anus and                            advanced to the the  cecum, identified by                            appendiceal orifice and ileocecal valve. The                            colonoscopy was performed without difficulty. The                            patient tolerated the procedure well. The quality                            of the bowel preparation was good. The ileocecal                            valve, appendiceal orifice, and rectum were                            photographed. Scope In: 8:09:52 AM Scope Out: 8:36:28 AM Scope Withdrawal Time: 0 hours 20 minutes 27 seconds  Total Procedure Duration: 0 hours 26 minutes 36 seconds  Findings:                 The perianal and digital rectal examinations were                            normal.                           Four sessile polyps were found in the rectum,                            recto-sigmoid colon, ascending colon and cecum. The                            polyps were 2 to 5 mm in size. These polyps were                            removed with a cold snare. Resection and retrieval  were complete. Estimated blood loss was minimal.                           Multiple small and large-mouthed diverticula were                            found in the sigmoid colon.                           Non-bleeding internal hemorrhoids were found during                            retroflexion. The hemorrhoids were small. Complications:            No immediate complications. Estimated Blood Loss:     Estimated blood loss was minimal. Impression:               - Four 2 to 5 mm polyps in the rectum, at the                            recto-sigmoid colon, in the ascending colon and in                            the cecum, removed with a cold snare. Resected and                            retrieved.                           - Diverticulosis in the sigmoid colon.                           - Non-bleeding internal hemorrhoids. Recommendation:           - Patient has a contact  number available for                            emergencies. The signs and symptoms of potential                            delayed complications were discussed with the                            patient. Return to normal activities tomorrow.                            Written discharge instructions were provided to the                            patient.                           - Resume previous diet.                           - Continue present medications.                           -  Await pathology results.                           - Repeat colonoscopy for surveillance based on                            pathology results.                           - Return to GI clinic PRN.                           - Use fiber, for example Citrucel, Fibercon, Konsyl                            or Metamucil. Gerrit Heck, MD 02/15/2020 8:41:10 AM

## 2020-02-15 NOTE — Progress Notes (Signed)
Pt's states no medical or surgical changes since previsit or office visit. VS by CW. 

## 2020-02-15 NOTE — Patient Instructions (Signed)
Impression/Recommendations:  Polyp handout given to patient. Diverticulosis handout given to patient. Hemorrhoid handout given to patient.  Resume previous diet. Continue present medications. Await pathology results.  Repeat colonoscopy for surveillance.  Date to be determined based on pathology results.  Return to GI clinic as needed.  Use fiber, for example Citrucel, Fibercon, Konsyl, or Metmamucil.  YOU HAD AN ENDOSCOPIC PROCEDURE TODAY AT [facility]:   Refer to the procedure report that was given to you for any specific questions about what was found during the examination.  If the procedure report does not answer your questions, please call your gastroenterologist to clarify.  If you requested that your care partner not be given the details of your procedure findings, then the procedure report has been included in a sealed envelope for you to review at your convenience later.  YOU SHOULD EXPECT: Some feelings of bloating in the abdomen. Passage of more gas than usual.  Walking can help get rid of the air that was put into your GI tract during the procedure and reduce the bloating. If you had a lower endoscopy (such as a colonoscopy or flexible sigmoidoscopy) you may notice spotting of blood in your stool or on the toilet paper. If you underwent a bowel prep for your procedure, you may not have a normal bowel movement for a few days.  Please Note:  You might notice some irritation and congestion in your nose or some drainage.  This is from the oxygen used during your procedure.  There is no need for concern and it should clear up in a day or so.  SYMPTOMS TO REPORT IMMEDIATELY:   Following lower endoscopy (colonoscopy or flexible sigmoidoscopy):  Excessive amounts of blood in the stool  Significant tenderness or worsening of abdominal pains  Swelling of the abdomen that is new, acute  Fever of 100F or higher For urgent or emergent issues, a gastroenterologist can  be reached at any hour by calling [phone]. Do not use MyChart messaging for urgent concerns.    DIET:  We do recommend a small meal at first, but then you may proceed to your regular diet.  Drink plenty of fluids but you should avoid alcoholic beverages for 24 hours.  ACTIVITY:  You should plan to take it easy for the rest of today and you should NOT DRIVE or use heavy machinery until tomorrow (because of the sedation medicines used during the test).    FOLLOW UP: Our staff will call the number listed on your records 48-72 hours following your procedure to check on you and address any questions or concerns that you may have regarding the information given to you following your procedure. If we do not reach you, we will leave a message.  We will attempt to reach you two times.  During this call, we will ask if you have developed any symptoms of COVID 19. If you develop any symptoms (ie: fever, flu-like symptoms, shortness of breath, cough etc.) before then, please call [phone].  If you test positive for Covid 19 in the 2 weeks post procedure, please call and report this information to [place].    If any biopsies were taken you will be contacted by phone or by letter within the next 1-3 weeks.  Please call us at [phone] if you have not heard about the biopsies in 3 weeks.    SIGNATURES/CONFIDENTIALITY: You and/or your care partner have signed paperwork which will be entered into your electronic medical record.  These signatures attest to the fact that that the information above on your After Visit Summary has been reviewed and is understood.  Full responsibility of the confidentiality of this discharge information lies with you and/or your care-partner.

## 2020-02-15 NOTE — Progress Notes (Signed)
To PACU, VSS. Report to [email or web address] 

## 2020-02-15 NOTE — Progress Notes (Signed)
Called to room to assist during endoscopic procedure.  Patient ID and intended procedure confirmed with present staff. Received instructions for my participation in the procedure from the performing physician.  

## 2020-02-19 ENCOUNTER — Telehealth: Payer: Self-pay | Admitting: *Deleted

## 2020-02-19 NOTE — Telephone Encounter (Signed)
  Follow up Call-  Call back number 02/15/2020  Post procedure Call Back phone  # [phone]  Permission to leave phone message Yes     Patient questions:  Do you have a fever, pain , or abdominal swelling? No. Pain Score  0 *  Have you tolerated food without any problems? Yes.    Have you been able to return to your normal activities? Yes.    Do you have any questions about your discharge instructions: Diet   No. Medications  No. Follow up visit  No.  Do you have questions or concerns about your Care? No.  Actions: * If pain score is 4 or above: No action needed, pain <4  1. Have you developed a fever since your procedure? NO  2.   Have you had an respiratory symptoms (SOB or cough) since your procedure? NO  3.   Have you tested positive for COVID 19 since your procedure NO  4.   Have you had any family members/close contacts diagnosed with the COVID 19 since your procedure?  NO   If yes to any of these questions please route to Joylene John, RN and Erenest Rasher, RN

## 2020-02-28 ENCOUNTER — Encounter: Payer: Self-pay | Admitting: Gastroenterology

## 2020-03-05 LAB — HM DIABETES EYE EXAM

## 2020-05-22 ENCOUNTER — Other Ambulatory Visit: Payer: Self-pay | Admitting: Family Medicine

## 2020-05-22 DIAGNOSIS — E1169 Type 2 diabetes mellitus with other specified complication: Secondary | ICD-10-CM

## 2020-07-02 ENCOUNTER — Other Ambulatory Visit: Payer: Self-pay

## 2020-07-02 ENCOUNTER — Encounter: Payer: Self-pay | Admitting: Family Medicine

## 2020-07-02 ENCOUNTER — Other Ambulatory Visit: Payer: Self-pay | Admitting: Family Medicine

## 2020-07-02 ENCOUNTER — Ambulatory Visit: Payer: BC Managed Care – PPO | Admitting: Family Medicine

## 2020-07-02 VITALS — BP 120/80 | HR 86 | Temp 98.5°F | Ht 68.0 in | Wt 339.0 lb

## 2020-07-02 DIAGNOSIS — I10 Essential (primary) hypertension: Secondary | ICD-10-CM | POA: Diagnosis not present

## 2020-07-02 DIAGNOSIS — E785 Hyperlipidemia, unspecified: Secondary | ICD-10-CM

## 2020-07-02 DIAGNOSIS — Z1159 Encounter for screening for other viral diseases: Secondary | ICD-10-CM | POA: Diagnosis not present

## 2020-07-02 DIAGNOSIS — Z23 Encounter for immunization: Secondary | ICD-10-CM

## 2020-07-02 DIAGNOSIS — E669 Obesity, unspecified: Secondary | ICD-10-CM | POA: Diagnosis not present

## 2020-07-02 DIAGNOSIS — E1169 Type 2 diabetes mellitus with other specified complication: Secondary | ICD-10-CM | POA: Diagnosis not present

## 2020-07-02 DIAGNOSIS — R972 Elevated prostate specific antigen [PSA]: Secondary | ICD-10-CM

## 2020-07-02 LAB — COMPREHENSIVE METABOLIC PANEL
ALT: 31 U/L (ref 0–53)
AST: 15 U/L (ref 0–37)
Albumin: 4.3 g/dL (ref 3.5–5.2)
Alkaline Phosphatase: 54 U/L (ref 39–117)
BUN: 13 mg/dL (ref 6–23)
CO2: 27 mEq/L (ref 19–32)
Calcium: 9.5 mg/dL (ref 8.4–10.5)
Chloride: 101 mEq/L (ref 96–112)
Creatinine, Ser: 0.87 mg/dL (ref 0.40–1.50)
GFR: 97.21 mL/min (ref >60.00)
Glucose, Bld: 124 mg/dL — ABNORMAL HIGH (ref 70–99)
Potassium: 4.3 mEq/L (ref 3.5–5.1)
Sodium: 137 mEq/L (ref 135–145)
Total Bilirubin: 0.5 mg/dL (ref 0.2–1.2)
Total Protein: 7.1 g/dL (ref 6.0–8.3)

## 2020-07-02 LAB — LIPID PANEL
Cholesterol: 177 mg/dL (ref 0–200)
HDL: 44.8 mg/dL (ref >39.00)
NonHDL: 132.41
Total CHOL/HDL Ratio: 4
Triglycerides: 205 mg/dL — ABNORMAL HIGH (ref 0.0–149.0)
VLDL: 41 mg/dL — ABNORMAL HIGH (ref 0.0–40.0)

## 2020-07-02 LAB — MICROALBUMIN / CREATININE URINE RATIO
Creatinine,U: 84.7 mg/dL
Microalb Creat Ratio: 2 mg/g (ref 0.0–30.0)
Microalb, Ur: 1.7 mg/dL (ref 0.0–1.9)

## 2020-07-02 LAB — HEMOGLOBIN A1C: Hgb A1c MFr Bld: 6.7 % — ABNORMAL HIGH (ref 4.6–6.5)

## 2020-07-02 LAB — LDL CHOLESTEROL, DIRECT: Direct LDL: 104 mg/dL

## 2020-07-02 MED ORDER — METFORMIN HCL 1000 MG PO TABS
ORAL_TABLET | ORAL | 1 refills | Status: DC
Start: 2020-07-02 — End: 2020-08-21

## 2020-07-02 MED ORDER — KETOCONAZOLE 2 % EX CREA: 1.0000 "application " | TOPICAL_CREAM | Freq: Every day | CUTANEOUS | 0 refills | Status: AC

## 2020-07-02 NOTE — Progress Notes (Signed)
Subjective:   Chief Complaint  Patient presents with  . Follow-up    Noah Taylor is a 54 y.o. male here for follow-up of diabetes.   Noah Taylor does not routinely monitor sugars.  Patient does not require insulin.   Medications include: Farxiga 10 mg/d, Metformin 1000 mg bid  Diet is healthy.  Exercise: not much lately  Hypertension Patient presents for hypertension follow up. He does not routinely monitor home blood pressures. He is compliant with medications. Patient has these side effects of medication: wonders if a lump on R chest area is caused by Norvasc.  Diet/exercise as above.   Past Medical History:  Diagnosis Date  . Diabetes mellitus without complication ([place])   . Hyperlipidemia   . Hypertension      Related testing: Retinal exam: Done Pneumovax: done  Objective:  BP 120/80 (BP Location: Left Arm, Patient Position: Sitting, Cuff Size: Large)   Pulse 86   Temp 98.5 F (36.9 C) (Oral)   Ht 5\' 8"  (1.727 m)   Wt (!) 339 lb (153.8 kg)   SpO2 94%   BMI 51.54 kg/m  General:  Well developed, well nourished, in no apparent distress Skin:  Warm, no pallor or diaphoresis Head:  Normocephalic, atraumatic Eyes:  Pupils equal and round, sclera anicteric without injection  Lungs:  CTAB, no access msc use Cardio:  RRR, no bruits, no LE edema Musculoskeletal:  Symmetrical muscle groups noted without atrophy or deformity Neuro:  Sensation intact to pinprick on feet Psych: Age appropriate judgment and insight  Assessment:   Diabetes mellitus type 2 in obese ([place]) - Plan: Lipid panel, Hemoglobin A1c, Microalbumin / creatinine urine ratio, Comprehensive metabolic panel  Essential hypertension  Need for influenza vaccination - Plan: Flu Vaccine QUAD 6+ mos PF IM (Fluarix Quad PF)  Increased prostate specific antigen (PSA) velocity - Plan: Ambulatory referral to Urology  Encounter for hepatitis C screening test for low risk patient - Plan: Hepatitis C antibody    Plan:   1. Cont Metformin 1000 mg bid, Farxiga 10 mg/d. Does not need to ck BS's. orders as above. Counseled on diet and exercise. 2. Stop Norvasc 2.5 mg/d, cont losartan 50 mg/d, HCTZ 25 mg/d. Monitor BP at home for next mo, if >130/90, will let us know. Will plan to increase losartan to 100 mg/d if so.  Refer to [facility] as he had a bad experience w staff at [place]. F/u in 6 mo for CPE or prn. The patient voiced understanding and agreement to the plan.  [place], DO 07/02/20 8:45 AM

## 2020-07-02 NOTE — Patient Instructions (Signed)
Give [place] 2-3 business days to get the results of your labs back.   Keep the diet clean and stay active.  If you do not hear anything about your referral in the next 1-2 weeks, call our office and ask for an update.  Monitor your blood pressure at home over the next month. If >130/90 consistently, let me know on MyChart.   Let us know if you need anything.

## 2020-07-03 LAB — HEPATITIS C ANTIBODY
Hepatitis C Ab: NONREACTIVE
SIGNAL TO CUT-OFF: 0.02 (ref <1.00)

## 2020-08-13 ENCOUNTER — Other Ambulatory Visit: Payer: BC Managed Care – PPO

## 2020-08-15 ENCOUNTER — Other Ambulatory Visit: Payer: BC Managed Care – PPO

## 2020-08-15 DIAGNOSIS — R972 Elevated prostate specific antigen [PSA]: Secondary | ICD-10-CM | POA: Diagnosis not present

## 2020-08-19 ENCOUNTER — Other Ambulatory Visit (INDEPENDENT_AMBULATORY_CARE_PROVIDER_SITE_OTHER): Payer: BC Managed Care – PPO

## 2020-08-19 ENCOUNTER — Other Ambulatory Visit: Payer: Self-pay

## 2020-08-19 DIAGNOSIS — E785 Hyperlipidemia, unspecified: Secondary | ICD-10-CM

## 2020-08-19 LAB — LIPID PANEL
Cholesterol: 189 mg/dL (ref 0–200)
HDL: 48.4 mg/dL (ref >39.00)
LDL Cholesterol: 101 mg/dL — ABNORMAL HIGH (ref 0–99)
NonHDL: 141.02
Total CHOL/HDL Ratio: 4
Triglycerides: 200 mg/dL — ABNORMAL HIGH (ref 0.0–149.0)
VLDL: 40 mg/dL (ref 0.0–40.0)

## 2020-08-21 ENCOUNTER — Other Ambulatory Visit: Payer: Self-pay | Admitting: Family Medicine

## 2020-08-21 DIAGNOSIS — E669 Obesity, unspecified: Secondary | ICD-10-CM

## 2020-08-21 DIAGNOSIS — E785 Hyperlipidemia, unspecified: Secondary | ICD-10-CM

## 2020-08-21 DIAGNOSIS — I10 Essential (primary) hypertension: Secondary | ICD-10-CM

## 2020-08-21 MED ORDER — FENOFIBRATE 48 MG PO TABS: 48.0000 mg | ORAL_TABLET | Freq: Every day | ORAL | 3 refills | Status: DC

## 2020-08-21 MED ORDER — HYDROCHLOROTHIAZIDE 25 MG PO TABS: 25.0000 mg | ORAL_TABLET | Freq: Every day | ORAL | 4 refills | Status: DC

## 2020-08-21 MED ORDER — DAPAGLIFLOZIN PROPANEDIOL 10 MG PO TABS: 10.0000 mg | ORAL_TABLET | Freq: Every day | ORAL | 4 refills | Status: DC

## 2020-08-21 MED ORDER — METFORMIN HCL 1000 MG PO TABS: 1000.0000 mg | ORAL_TABLET | Freq: Two times a day (BID) | ORAL | 4 refills | Status: DC

## 2020-08-21 MED ORDER — LOSARTAN POTASSIUM 50 MG PO TABS: 50.0000 mg | ORAL_TABLET | Freq: Every day | ORAL | 4 refills | Status: DC

## 2020-08-21 MED ORDER — PRAVASTATIN SODIUM 20 MG PO TABS: 20.0000 mg | ORAL_TABLET | Freq: Every day | ORAL | 4 refills | Status: DC

## 2020-12-11 ENCOUNTER — Other Ambulatory Visit: Payer: Self-pay | Admitting: *Deleted

## 2020-12-11 MED ORDER — FENOFIBRATE 48 MG PO TABS: 48.0000 mg | ORAL_TABLET | Freq: Every day | ORAL | 0 refills | Status: DC

## 2021-01-02 ENCOUNTER — Encounter: Payer: BC Managed Care – PPO | Admitting: Family Medicine

## 2021-01-13 ENCOUNTER — Encounter: Payer: Self-pay | Admitting: Family Medicine

## 2021-01-13 ENCOUNTER — Ambulatory Visit (INDEPENDENT_AMBULATORY_CARE_PROVIDER_SITE_OTHER): Payer: BC Managed Care – PPO | Admitting: Family Medicine

## 2021-01-13 ENCOUNTER — Other Ambulatory Visit: Payer: Self-pay

## 2021-01-13 VITALS — BP 118/82 | HR 74 | Temp 98.4°F | Ht 68.0 in | Wt 345.2 lb

## 2021-01-13 DIAGNOSIS — Z23 Encounter for immunization: Secondary | ICD-10-CM | POA: Diagnosis not present

## 2021-01-13 DIAGNOSIS — E1169 Type 2 diabetes mellitus with other specified complication: Secondary | ICD-10-CM | POA: Diagnosis not present

## 2021-01-13 DIAGNOSIS — Z0000 Encounter for general adult medical examination without abnormal findings: Secondary | ICD-10-CM | POA: Diagnosis not present

## 2021-01-13 DIAGNOSIS — E669 Obesity, unspecified: Secondary | ICD-10-CM | POA: Diagnosis not present

## 2021-01-13 NOTE — Progress Notes (Signed)
Chief Complaint  Patient presents with   Annual Exam    Well Male Noah Taylor is here for a complete physical.   His last physical was >1 year ago.  Current diet: in general, a "healthy" diet.  Current exercise: none Weight trend: gained a few lbs Fatigue out of ordinary? No. Seat belt? Yes.    Health maintenance Shingrix- No Colonoscopy- Yes Tetanus- Yes HIV- Yes Hep C- Yes   Past Medical History:  Diagnosis Date   Diabetes mellitus without complication ([place])    Hyperlipidemia    Hypertension       Past Surgical History:  Procedure Laterality Date   COLONOSCOPY  1991   in [place]    Medications  Current Outpatient Medications on File Prior to Visit  Medication Sig Dispense Refill   dapagliflozin propanediol (FARXIGA) 10 MG TABS tablet Take 1 tablet (10 mg total) by mouth daily. 90 tablet 4   fenofibrate (TRICOR) 48 MG tablet Take 1 tablet (48 mg total) by mouth daily. 90 tablet 0   hydrochlorothiazide (HYDRODIURIL) 25 MG tablet Take 1 tablet (25 mg total) by mouth daily. 90 tablet 4   losartan (COZAAR) 50 MG tablet Take 1 tablet (50 mg total) by mouth daily. 90 tablet 4   metFORMIN (GLUCOPHAGE) 1000 MG tablet Take 1 tablet (1,000 mg total) by mouth 2 (two) times daily with a meal. 180 tablet 4   pravastatin (PRAVACHOL) 20 MG tablet Take 1 tablet (20 mg total) by mouth daily. 90 tablet 4    Allergies No Known Allergies  Family History Family History  Problem Relation Age of Onset   Arthritis Mother    Cancer Mother        ovarian   Hyperlipidemia Father    Hypertension Father    Cancer Father        lung cancer   Colon cancer Neg Hx    Colon polyps Neg Hx    Esophageal cancer Neg Hx    Rectal cancer Neg Hx    Stomach cancer Neg Hx     Review of Systems: Constitutional:  no fevers Eye:  no recent significant change in vision Ear/Nose/Mouth/Throat:  Ears:  no hearing loss Nose/Mouth/Throat:  no complaints of  nasal congestion, no sore throat Cardiovascular:  no chest pain Respiratory:  no shortness of breath Gastrointestinal:  no change in bowel habits GU:  Male: negative for dysuria, frequency Musculoskeletal/Extremities:  no joint pain Integumentary (Skin/Breast):  no abnormal skin lesions reported Neurologic:  no headaches Endocrine: No unexpected weight changes Hematologic/Lymphatic:  no abnormal bleeding  Exam BP 118/82   Pulse 74   Temp 98.4 F (36.9 C) (Oral)   Ht 5\' 8"  (1.727 m)   Wt (!) 345 lb 4 oz (156.6 kg)   SpO2 97%   BMI 52.50 kg/m  General:  well developed, well nourished, in no apparent distress Skin:  no significant moles, warts, or growths Head:  no masses, lesions, or tenderness Eyes:  pupils equal and round, sclera anicteric without injection Ears:  canals without lesions, TMs shiny without retraction, no obvious effusion, no erythema Nose:  nares patent, septum midline, mucosa normal Throat/Pharynx:  lips and gingiva without lesion; tongue and uvula midline; non-inflamed pharynx; no exudates or postnasal drainage Neck: neck supple without adenopathy, thyromegaly, or masses Cardiac: RRR, no bruits, no LE edema Lungs:  clear to auscultation, breath sounds equal bilaterally, no respiratory distress Abdomen: BS+, soft, non-tender, non-distended, no masses  or organomegaly noted Rectal: Deferred Musculoskeletal:  symmetrical muscle groups noted without atrophy or deformity Neuro:  gait normal; deep tendon reflexes normal and symmetric Psych: well oriented with normal range of affect and appropriate judgment/insight  Assessment and Plan  Well adult exam  Diabetes mellitus type 2 in obese ([place]), Chronic - Plan: Lipid panel, Hemoglobin A1c, CBC, Comprehensive metabolic panel  Morbid obesity ([place]), Chronic   Well 55 y.o. male. Counseled on diet and exercise. Gets PSA's thru urology now. Shingrix today.  Covid vaccine booster rec'd.  Immunizations, labs, and  further orders as above. Follow up 3-6 mo pending above.  The patient voiced understanding and agreement to the plan.  [place], DO 01/13/21 2:56 PM

## 2021-01-13 NOTE — Patient Instructions (Addendum)
Give [place] 2-3 business days to get the results of your labs back.   Keep the diet clean and stay active.  I would consider the covid vaccine booster.   Let us know if you need anything.

## 2021-01-13 NOTE — Addendum Note (Signed)
Addended by: Sharon Seller B on: 01/13/2021 03:00 PM   Modules accepted: Orders

## 2021-01-14 LAB — COMPREHENSIVE METABOLIC PANEL
ALT: 48 U/L (ref 0–53)
AST: 26 U/L (ref 0–37)
Albumin: 4.4 g/dL (ref 3.5–5.2)
Alkaline Phosphatase: 51 U/L (ref 39–117)
BUN: 12 mg/dL (ref 6–23)
CO2: 28 mEq/L (ref 19–32)
Calcium: 9.8 mg/dL (ref 8.4–10.5)
Chloride: 97 mEq/L (ref 96–112)
Creatinine, Ser: 0.86 mg/dL (ref 0.40–1.50)
GFR: 97.18 mL/min (ref >60.00)
Glucose, Bld: 86 mg/dL (ref 70–99)
Potassium: 3.7 mEq/L (ref 3.5–5.1)
Sodium: 135 mEq/L (ref 135–145)
Total Bilirubin: 0.7 mg/dL (ref 0.2–1.2)
Total Protein: 7.2 g/dL (ref 6.0–8.3)

## 2021-01-14 LAB — LIPID PANEL
Cholesterol: 166 mg/dL (ref 0–200)
HDL: 46.9 mg/dL (ref >39.00)
LDL Cholesterol: 84 mg/dL (ref 0–99)
NonHDL: 119.48
Total CHOL/HDL Ratio: 4
Triglycerides: 177 mg/dL — ABNORMAL HIGH (ref 0.0–149.0)
VLDL: 35.4 mg/dL (ref 0.0–40.0)

## 2021-01-14 LAB — CBC
HCT: 49.5 % (ref 39.0–52.0)
Hemoglobin: 16.4 g/dL (ref 13.0–17.0)
MCHC: 33 g/dL (ref 30.0–36.0)
MCV: 80.8 fl (ref 78.0–100.0)
Platelets: 310 10*3/uL (ref 150.0–400.0)
RBC: 6.13 Mil/uL — ABNORMAL HIGH (ref 4.22–5.81)
RDW: 14.2 % (ref 11.5–15.5)
WBC: 9.6 10*3/uL (ref 4.0–10.5)

## 2021-01-14 LAB — HEMOGLOBIN A1C: Hgb A1c MFr Bld: 7.1 % — ABNORMAL HIGH (ref 4.6–6.5)

## 2021-03-17 ENCOUNTER — Ambulatory Visit (INDEPENDENT_AMBULATORY_CARE_PROVIDER_SITE_OTHER): Payer: BC Managed Care – PPO

## 2021-03-17 ENCOUNTER — Other Ambulatory Visit: Payer: Self-pay

## 2021-03-17 DIAGNOSIS — Z23 Encounter for immunization: Secondary | ICD-10-CM

## 2021-03-17 NOTE — Progress Notes (Signed)
Patient here today for second shingrix vaccine. 0.57m given in left deltoid IM. Patient tolerated well. VIS given. [facility] updated.

## 2021-03-21 ENCOUNTER — Other Ambulatory Visit: Payer: Self-pay | Admitting: Family Medicine

## 2021-04-17 ENCOUNTER — Encounter: Payer: Self-pay | Admitting: Family Medicine

## 2021-04-17 ENCOUNTER — Other Ambulatory Visit: Payer: Self-pay

## 2021-04-17 ENCOUNTER — Ambulatory Visit: Payer: BC Managed Care – PPO | Admitting: Family Medicine

## 2021-04-17 VITALS — BP 118/76 | HR 82 | Temp 98.8°F | Ht 68.0 in | Wt 339.5 lb

## 2021-04-17 DIAGNOSIS — E781 Pure hyperglyceridemia: Secondary | ICD-10-CM

## 2021-04-17 DIAGNOSIS — E1169 Type 2 diabetes mellitus with other specified complication: Secondary | ICD-10-CM | POA: Diagnosis not present

## 2021-04-17 DIAGNOSIS — E669 Obesity, unspecified: Secondary | ICD-10-CM

## 2021-04-17 DIAGNOSIS — Z23 Encounter for immunization: Secondary | ICD-10-CM

## 2021-04-17 LAB — LIPID PANEL
Cholesterol: 136 mg/dL (ref 0–200)
HDL: 46.5 mg/dL (ref >39.00)
LDL Cholesterol: 69 mg/dL (ref 0–99)
NonHDL: 89.68
Total CHOL/HDL Ratio: 3
Triglycerides: 101 mg/dL (ref 0.0–149.0)
VLDL: 20.2 mg/dL (ref 0.0–40.0)

## 2021-04-17 LAB — HEMOGLOBIN A1C: Hgb A1c MFr Bld: 6.9 % — ABNORMAL HIGH (ref 4.6–6.5)

## 2021-04-17 NOTE — Progress Notes (Signed)
Subjective:   Chief Complaint  Patient presents with   Follow-up    Sinus congestion    Noah Taylor is a 55 y.o. male here for follow-up of diabetes.   Noah Taylor's self monitored glucose range is 130's.  Patient denies hypoglycemic reactions. He checks his glucose levels sporadically. Patient does not require insulin.   Medications include: metformin 1000 mg bid, Farxiga 10 mg/d Diet is fair.  Exercise: walking No CP or SOB.   Hyperlipidemia Patient presents for dyslipidemia follow up. Currently being treated with pravastatin 20 mg/d and compliance with treatment thus far has been good. He denies myalgias. Diet/exercise as above.  The patient is not known to have coexisting coronary artery disease.  Past Medical History:  Diagnosis Date   Diabetes mellitus without complication ([place])    Hyperlipidemia    Hypertension      Related testing: Retinal exam: Due; scheduled next mo Pneumovax: done  Objective:  BP 118/76   Pulse 82   Temp 98.8 F (37.1 C) (Oral)   Ht 5\' 8"  (1.727 m)   Wt (!) 339 lb 8 oz (154 kg)   SpO2 98%   BMI 51.62 kg/m  General:  Well developed, well nourished, in no apparent distress Skin:  Warm, no pallor or diaphoresis HENT:  ears neg, nares patent w/o dc, mmm w/o exudate or erythema Eyes:  Pupils equal and round, sclera anicteric without injection  Lungs:  CTAB, no access msc use Cardio:  RRR, no bruits, no LE edema Psych: Age appropriate judgment and insight  Assessment:   Diabetes mellitus type 2 in obese ([facility]CC)  Hypertriglyceridemia   Plan:   Chronic, uncontrolled. Goal is <7 a1c. Cont Metformin 1000 mg bid, Farxiga 10 mg/d. Counseled on diet and exercise. Chronic, uncontrolled. Cont pravastatin. If still elevated, will increase pravastatin.  F/u in 3-6 mo. The patient voiced understanding and agreement to the plan.  [place], DO 04/17/21 7:06 AM

## 2021-04-17 NOTE — Patient Instructions (Addendum)
Give [place] 2-3 business days to get the results of your labs back. Our follow up will be based on your results.   Continue to push fluids, practice good hand hygiene, and cover your mouth if you cough.  If you start having fevers, shaking or shortness of breath, seek immediate care.  Let us know if you need anything.

## 2021-04-21 ENCOUNTER — Other Ambulatory Visit: Payer: Self-pay | Admitting: Family Medicine

## 2021-04-21 MED ORDER — PREDNISONE 20 MG PO TABS: 40.0000 mg | ORAL_TABLET | Freq: Every day | ORAL | 0 refills | Status: AC

## 2021-05-21 DIAGNOSIS — E119 Type 2 diabetes mellitus without complications: Secondary | ICD-10-CM | POA: Diagnosis not present

## 2021-05-21 LAB — HM DIABETES EYE EXAM

## 2021-05-27 ENCOUNTER — Encounter: Payer: Self-pay | Admitting: Family Medicine

## 2021-07-17 ENCOUNTER — Ambulatory Visit: Payer: BC Managed Care – PPO | Admitting: Family Medicine

## 2021-08-04 ENCOUNTER — Encounter: Payer: Self-pay | Admitting: Family Medicine

## 2021-08-10 ENCOUNTER — Ambulatory Visit: Payer: BC Managed Care – PPO | Admitting: Family Medicine

## 2021-08-10 ENCOUNTER — Encounter: Payer: Self-pay | Admitting: Family Medicine

## 2021-08-10 VITALS — BP 138/88 | HR 82 | Temp 98.3°F | Ht 68.0 in | Wt 339.5 lb

## 2021-08-10 DIAGNOSIS — N6341 Unspecified lump in right breast, subareolar: Secondary | ICD-10-CM

## 2021-08-10 DIAGNOSIS — N61 Mastitis without abscess: Secondary | ICD-10-CM | POA: Diagnosis not present

## 2021-08-10 MED ORDER — CEPHALEXIN 500 MG PO CAPS: 500.0000 mg | ORAL_CAPSULE | Freq: Three times a day (TID) | ORAL | 0 refills | Status: AC

## 2021-08-10 NOTE — Progress Notes (Signed)
Chief Complaint  Patient presents with   Breast Problem    Breast soreness and pain    Noah Taylor is a 56 y.o. male here for a skin complaint.  Duration: 2 weeks Location: R chest area Known R sided gynecomastia Pruritic? No Painful? Yes Drainage? No Other associated symptoms: bleeding, redness No fevers, spreading redness Therapies tried thus far: none  Past Medical History:  Diagnosis Date   Diabetes mellitus without complication ([facility])    Hyperlipidemia    Hypertension     BP 138/88    Pulse 82    Temp 98.3 F (36.8 C) (Oral)    Ht 5\' 8"  (1.727 m)    Wt (!) 339 lb 8 oz (154 kg)    SpO2 98%    BMI 51.62 kg/m  Gen: awake, alert, appearing stated age Lungs: No accessory muscle use Skin: Excoriation over the R nipple overlying a 5cm x 6 cm area of induration that is ttp. No drainage, erythema, fluctuance, dimpling L breast appears normal Psych: Age appropriate judgment and insight  Mastitis - Plan: cephALEXin (KEFLEX) 500 MG capsule  Subareolar mass of right breast - Plan: US BREAST COMPLETE UNI RIGHT INC AXILLA  New problem, uncertain prog. Known hx of gynecomastia from 2021 mammogram. Will empirically tx for infection of tissue around breast. Ck [place] given pain and swelling. If no improvement, would consider refer to gen surgery for discussion of options.  F/u prn. The patient voiced understanding and agreement to the plan.  [place], DO 08/10/21 7:55 AM

## 2021-08-10 NOTE — Patient Instructions (Signed)
Ice/cold pack over area for 10-15 min twice daily.  OK to take Tylenol 1000 mg (2 extra strength tabs) or 975 mg (3 regular strength tabs) every 6 hours as needed.  We will be in touch regarding your [place] results.   Let me now if we aren't turning the corner by the end of the week.

## 2021-08-20 ENCOUNTER — Encounter: Payer: Self-pay | Admitting: Family Medicine

## 2021-08-20 DIAGNOSIS — N61 Mastitis without abscess: Secondary | ICD-10-CM

## 2021-08-20 DIAGNOSIS — N6341 Unspecified lump in right breast, subareolar: Secondary | ICD-10-CM

## 2021-08-22 ENCOUNTER — Other Ambulatory Visit: Payer: Self-pay | Admitting: Family Medicine

## 2021-08-22 DIAGNOSIS — N6341 Unspecified lump in right breast, subareolar: Secondary | ICD-10-CM

## 2021-08-22 DIAGNOSIS — N61 Mastitis without abscess: Secondary | ICD-10-CM

## 2021-09-16 ENCOUNTER — Other Ambulatory Visit: Payer: Self-pay | Admitting: Family Medicine

## 2021-09-16 ENCOUNTER — Ambulatory Visit
Admission: RE | Admit: 2021-09-16 | Discharge: 2021-09-16 | Disposition: A | Discharge status: Home / Self Care | Payer: BC Managed Care – PPO | Source: Ambulatory Visit | Attending: Family Medicine | Admitting: Family Medicine

## 2021-09-16 DIAGNOSIS — N6489 Other specified disorders of breast: Secondary | ICD-10-CM | POA: Diagnosis not present

## 2021-09-16 DIAGNOSIS — N6341 Unspecified lump in right breast, subareolar: Secondary | ICD-10-CM

## 2021-09-16 DIAGNOSIS — N61 Mastitis without abscess: Secondary | ICD-10-CM

## 2021-09-16 DIAGNOSIS — R599 Enlarged lymph nodes, unspecified: Secondary | ICD-10-CM

## 2021-09-16 DIAGNOSIS — R928 Other abnormal and inconclusive findings on diagnostic imaging of breast: Secondary | ICD-10-CM | POA: Diagnosis not present

## 2021-10-02 ENCOUNTER — Ambulatory Visit
Admission: RE | Admit: 2021-10-02 | Discharge: 2021-10-02 | Disposition: A | Discharge status: Home / Self Care | Payer: BC Managed Care – PPO | Source: Ambulatory Visit | Attending: Family Medicine | Admitting: Family Medicine

## 2021-10-02 ENCOUNTER — Other Ambulatory Visit: Payer: Self-pay | Admitting: Family Medicine

## 2021-10-02 DIAGNOSIS — R590 Localized enlarged lymph nodes: Secondary | ICD-10-CM | POA: Diagnosis not present

## 2021-10-02 DIAGNOSIS — C773 Secondary and unspecified malignant neoplasm of axilla and upper limb lymph nodes: Secondary | ICD-10-CM | POA: Diagnosis not present

## 2021-10-02 DIAGNOSIS — N6341 Unspecified lump in right breast, subareolar: Secondary | ICD-10-CM

## 2021-10-02 DIAGNOSIS — C50821 Malignant neoplasm of overlapping sites of right male breast: Secondary | ICD-10-CM | POA: Diagnosis not present

## 2021-10-02 DIAGNOSIS — N6315 Unspecified lump in the right breast, overlapping quadrants: Secondary | ICD-10-CM | POA: Diagnosis not present

## 2021-10-02 DIAGNOSIS — R599 Enlarged lymph nodes, unspecified: Secondary | ICD-10-CM

## 2021-10-05 ENCOUNTER — Other Ambulatory Visit: Payer: Self-pay | Admitting: Family Medicine

## 2021-10-05 DIAGNOSIS — N6341 Unspecified lump in right breast, subareolar: Secondary | ICD-10-CM

## 2021-10-06 ENCOUNTER — Encounter: Payer: Self-pay | Admitting: *Deleted

## 2021-10-06 ENCOUNTER — Encounter: Payer: Self-pay | Admitting: Family Medicine

## 2021-10-06 ENCOUNTER — Other Ambulatory Visit: Payer: Self-pay | Admitting: Family Medicine

## 2021-10-06 DIAGNOSIS — N6341 Unspecified lump in right breast, subareolar: Secondary | ICD-10-CM

## 2021-10-06 NOTE — Progress Notes (Signed)
Reached out to Noah Taylor to introduce myself as the office RN Navigator and explain our new patient process. Reviewed the reason for their referral and scheduled their new patient appointment along with labs. Provided address and directions to the office including call back phone number. Reviewed with patient any concerns they may have or any possible barriers to attending their appointment.  ? ?Informed patient about my role as a navigator and that I will meet with them prior to their New Patient appointment and more fully discuss what services I can provide. At this time patient has no further questions or needs.   ? ?Oncology Nurse Navigator Documentation ? ?Oncology Nurse Navigator Flowsheets 10/06/2021  ?Abnormal Finding Date 09/16/2021  ?Confirmed Diagnosis Date 10/02/2021  ?Diagnosis Status Additional Work Up  ?Navigator Follow Up Date: 10/08/2021  ?Navigator Follow Up Reason: New Patient Appointment  ?Navigator Location [facility]  ?Referral Date to RadOnc/MedOnc 10/06/2021  ?Navigator Encounter Type Introductory Phone Call  ?Patient Visit Type MedOnc  ?Treatment Phase Post-Tx Follow-up  ?Barriers/Navigation Needs Coordination of Care;Education  ?Education Other  ?Interventions Coordination of Care;Education  ?Acuity Level 2-Minimal Needs (1-2 Barriers Identified)  ?Coordination of Care Appts  ?Education Method Verbal  ?Support Groups/Services Friends and Family  ?Time Spent with Patient 45  ?  ?

## 2021-10-08 ENCOUNTER — Inpatient Hospital Stay: Payer: BC Managed Care – PPO | Attending: Hematology & Oncology

## 2021-10-08 ENCOUNTER — Other Ambulatory Visit: Payer: Self-pay

## 2021-10-08 ENCOUNTER — Inpatient Hospital Stay (HOSPITAL_BASED_OUTPATIENT_CLINIC_OR_DEPARTMENT_OTHER): Payer: BC Managed Care – PPO | Admitting: Hematology & Oncology

## 2021-10-08 VITALS — BP 141/83 | HR 85 | Temp 99.0°F | Resp 19 | Ht 68.0 in | Wt 340.0 lb

## 2021-10-08 DIAGNOSIS — C50021 Malignant neoplasm of nipple and areola, right male breast: Secondary | ICD-10-CM

## 2021-10-08 DIAGNOSIS — Z7984 Long term (current) use of oral hypoglycemic drugs: Secondary | ICD-10-CM | POA: Diagnosis not present

## 2021-10-08 DIAGNOSIS — I10 Essential (primary) hypertension: Secondary | ICD-10-CM | POA: Insufficient documentation

## 2021-10-08 DIAGNOSIS — Z8041 Family history of malignant neoplasm of ovary: Secondary | ICD-10-CM

## 2021-10-08 DIAGNOSIS — E119 Type 2 diabetes mellitus without complications: Secondary | ICD-10-CM | POA: Diagnosis not present

## 2021-10-08 DIAGNOSIS — Z801 Family history of malignant neoplasm of trachea, bronchus and lung: Secondary | ICD-10-CM | POA: Insufficient documentation

## 2021-10-08 DIAGNOSIS — C773 Secondary and unspecified malignant neoplasm of axilla and upper limb lymph nodes: Secondary | ICD-10-CM

## 2021-10-08 DIAGNOSIS — Z79899 Other long term (current) drug therapy: Secondary | ICD-10-CM | POA: Diagnosis not present

## 2021-10-08 LAB — CMP (CANCER CENTER ONLY)
ALT: 39 U/L (ref 0–44)
AST: 20 U/L (ref 15–41)
Albumin: 4.4 g/dL (ref 3.5–5.0)
Alkaline Phosphatase: 51 U/L (ref 38–126)
Anion gap: 10 (ref 5–15)
BUN: 17 mg/dL (ref 6–20)
CO2: 29 mmol/L (ref 22–32)
Calcium: 10.3 mg/dL (ref 8.9–10.3)
Chloride: 96 mmol/L — ABNORMAL LOW (ref 98–111)
Creatinine: 1.24 mg/dL (ref 0.61–1.24)
GFR, Estimated: >60 mL/min (ref >60)
Glucose, Bld: 145 mg/dL — ABNORMAL HIGH (ref 70–99)
Potassium: 3.9 mmol/L (ref 3.5–5.1)
Sodium: 135 mmol/L (ref 135–145)
Total Bilirubin: 0.6 mg/dL (ref 0.3–1.2)
Total Protein: 7.6 g/dL (ref 6.5–8.1)

## 2021-10-08 LAB — CBC WITH DIFFERENTIAL (CANCER CENTER ONLY)
Abs Immature Granulocytes: 0.06 10*3/uL (ref 0.00–0.07)
Basophils Absolute: 0 10*3/uL (ref 0.0–0.1)
Basophils Relative: 0 %
Eosinophils Absolute: 0.1 10*3/uL (ref 0.0–0.5)
Eosinophils Relative: 1 %
HCT: 50.3 % (ref 39.0–52.0)
Hemoglobin: 16.7 g/dL (ref 13.0–17.0)
Immature Granulocytes: 1 %
Lymphocytes Relative: 31 %
Lymphs Abs: 3.3 10*3/uL (ref 0.7–4.0)
MCH: 26.7 pg (ref 26.0–34.0)
MCHC: 33.2 g/dL (ref 30.0–36.0)
MCV: 80.5 fL (ref 80.0–100.0)
Monocytes Absolute: 1.2 10*3/uL — ABNORMAL HIGH (ref 0.1–1.0)
Monocytes Relative: 11 %
Neutro Abs: 5.7 10*3/uL (ref 1.7–7.7)
Neutrophils Relative %: 56 %
Platelet Count: 329 10*3/uL (ref 150–400)
RBC: 6.25 MIL/uL — ABNORMAL HIGH (ref 4.22–5.81)
RDW: 13.8 % (ref 11.5–15.5)
WBC Count: 10.4 10*3/uL (ref 4.0–10.5)
nRBC: 0 % (ref 0.0–0.2)

## 2021-10-08 LAB — LACTATE DEHYDROGENASE: LDH: 138 U/L (ref 98–192)

## 2021-10-08 NOTE — Progress Notes (Signed)
Referral MD ? 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 ?Chief Complaint  ?Patient presents with  ? New Patient (Initial Visit)  ?: I have breast cancer. ? ?HPI: Noah Taylor is a really nice 56 year old white male.  He is originally from [place].  He comes in with his wife.  It was incredibly fun talking to him about [place].  His wife is clearly a Animator.  They went to [facility] because.  They have been up in [place] for about 20 years.  He works at [employer].  She is a Education officer, museum. ? ?He noted in the right breast a few months ago a nodule.  I think it was initially found and was thought to be a cyst.  It was watched.  He had no pain.  There is no swelling. ? ?Then, he began to have some bleeding in the bed sheet.  He found that the areola was opened up. ? ?He went back to his family doctor.  He then went underwent an mammogram.  This was done on 09/15/2021.  This showed a retroareolar mass measuring 4.9 x 2.3 x 3.4 cm.  Also noted was a abnormal axillary lymph node. ? ?A biopsy was then done.  This was done on 10/02/2021.  The pathology report ([id number]) showed an invasive ductal carcinoma in the breast.  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 ? ?The prognostic markers were not back yet. ? ?There is a history of lung cancer in the family.  The history of ovarian cancer in the family.  He clearly will need to have genetic testing done. ? ?He feels well.  He does not smoke.  He has rare alcohol use. ? ?He does have diabetes.   ? ?He has had no change in bowel or bladder habits.  There is no weight loss or weight gain.  He has had no rashes.  He has not felt any swollen lymph nodes.  He has had no cough or shortness of breath. ? ?Overall, I would say his performance status is ECOG 0. ? ? ? ?Past Medical History:  ?Diagnosis Date  ? Diabetes mellitus without complication ([place])   ? Hyperlipidemia   ? Hypertension   ?: ? ? ?Past Surgical History:   ?Procedure Laterality Date  ? COLONOSCOPY  1991  ? in [facility]  ? TONSILLECTOMY AND ADENOIDECTOMY  1969  ?: ? ? ?Current Outpatient Medications:  ?  dapagliflozin propanediol (FARXIGA) 10 MG TABS tablet, Take 1 tablet (10 mg total) by mouth daily., Disp: 90 tablet, Rfl: 4 ?  fenofibrate (TRICOR) 48 MG tablet, TAKE 1 TABLET DAILY, Disp: 90 tablet, Rfl: 3 ?  hydrochlorothiazide (HYDRODIURIL) 25 MG tablet, Take 1 tablet (25 mg total) by mouth daily., Disp: 90 tablet, Rfl: 4 ?  losartan (COZAAR) 50 MG tablet, Take 1 tablet (50 mg total) by mouth daily., Disp: 90 tablet, Rfl: 4 ?  metFORMIN (GLUCOPHAGE) 1000 MG tablet, Take 1 tablet (1,000 mg total) by mouth 2 (two) times daily with a meal., Disp: 180 tablet, Rfl: 4 ?  pravastatin (PRAVACHOL) 20 MG tablet, Take 1 tablet (20 mg total) by mouth daily., Disp: 90 tablet, Rfl: 4: ? ?: ? ?No Known Allergies: ? ? ?Family History  ?Problem Relation Age of Onset  ? Arthritis Mother   ? Cancer Mother   ?     ovarian  ? Hyperlipidemia Father   ? Hypertension Father   ? Cancer Father   ?  lung cancer  ? Colon cancer Neg Hx   ? Colon polyps Neg Hx   ? Esophageal cancer Neg Hx   ? Rectal cancer Neg Hx   ? Stomach cancer Neg Hx   ?: ? ? ?Social History  ? ?Socioeconomic History  ? Marital status: Married  ?  Spouse name: Not on file  ? Number of children: Not on file  ? Years of education: Not on file  ? Highest education level: Not on file  ?Occupational History  ? Not on file  ?Tobacco Use  ? Smoking status: Never  ? Smokeless tobacco: Never  ?Vaping Use  ? Vaping Use: Never used  ?Substance and Sexual Activity  ? Alcohol use: Not on file  ?  Comment: rarely  ? Drug use: Never  ? Sexual activity: Not on file  ?Other Topics Concern  ? Not on file  ?Social History Narrative  ? Not on file  ? ?Social Determinants of Health  ? ?Financial Resource Strain: Not on file  ?Food Insecurity: Not on file  ?Transportation Needs: Not on file  ?Physical Activity: Not on file  ?Stress: Not on file   ?Social Connections: Not on file  ?Intimate Partner Violence: Not on file  ?: ? ?Review of Systems  ?Constitutional: Negative.   ?HENT: Negative.    ?Eyes: Negative.   ?Respiratory: Negative.    ?Cardiovascular: Negative.   ?Gastrointestinal: Negative.   ?Genitourinary: Negative.   ?Musculoskeletal: Negative.   ?Skin: Negative.   ?Neurological: Negative.   ?Endo/Heme/Allergies: Negative.   ?Psychiatric/Behavioral: Negative.    ? ? ?Exam: ?'@IPVITALS'$ @ ?Physical Exam ?Vitals reviewed.  ?Constitutional:   ?   Comments: Left breast shows no masses, edema or erythema.  There is no left axillary adenopathy.  Right breast does show a mass in the retroareolar region.  This probably measures about 5 x 4 cm.  It is firm.  It is not mobile.  I really cannot palpate any adenopathy in the right axilla.  ?HENT:  ?   Head: Normocephalic and atraumatic.  ?Eyes:  ?   Pupils: Pupils are equal, round, and reactive to light.  ?Cardiovascular:  ?   Rate and Rhythm: Normal rate and regular rhythm.  ?   Heart sounds: Normal heart sounds.  ?Pulmonary:  ?   Effort: Pulmonary effort is normal.  ?   Breath sounds: Normal breath sounds.  ?Abdominal:  ?   General: Bowel sounds are normal.  ?   Palpations: Abdomen is soft.  ?Musculoskeletal:     ?   General: No tenderness or deformity. Normal range of motion.  ?   Cervical back: Normal range of motion.  ?Lymphadenopathy:  ?   Cervical: No cervical adenopathy.  ?Skin: ?   General: Skin is warm and dry.  ?   Findings: No erythema or rash.  ?Neurological:  ?   Mental Status: He is alert and oriented to person, place, and time.  ?Psychiatric:     ?   Behavior: Behavior normal.     ?   Thought Content: Thought content normal.     ?   Judgment: Judgment normal.  ? ? ? ?Recent Labs  ?  10/08/21 ?1401  ?WBC 10.4  ?HGB 16.7  ?HCT 50.3  ?PLT 329  ? ? ?Recent Labs  ?  10/08/21 ?1401  ?NA 135  ?K 3.9  ?CL 96*  ?CO2 29  ?GLUCOSE 145*  ?BUN 17  ?CREATININE 1.24  ?CALCIUM 10.3  ? ? ?Blood smear  review:  None ? ?Pathology: See above ? ? ? ?Assessment and Plan: Noah Taylor is a really nice 56 year old white male.  He has an invasive ductal carcinoma of the right breast.  This is at least stage II by virtue of the size of the primary tumor and the fact that there is a positive lymph node. ? ?I think the issue with him is going to be the actual stage.  I think a PET scan would be warranted in this situation.  I think there would be a reasonable chance that there might be advanced disease.  Again I think a PET scan would certainly help [place] out. ? ?Obviously, the prognostic markers will help.  We will have to see what they are. ? ?He sees Dr. Adair Laundry in [facility] tomorrow.  I will have to talk to him about the fact that we probably will need neoadjuvant chemotherapy. ? ?As far as chemotherapeutic choices, I probably would consider him for an aggressive protocol utilizing Taxotere/Adriamycin/Cytoxan.  I think this would be a very appropriate choice for him since he is in great shape. ? ?Given his size, we may have to figure out how to appropriately dose him. ? ?He will need to have a Port-A-Cath placed.  We will talk to Dr. Adair Laundry about this. ? ?I will try to get the PET scan set up for him tomorrow. ? ?I think the PET scan will be critical as this will really help [place] decide how we can give him neoadjuvant therapy. ? ?I probably given him 4 cycles of chemotherapy in the neoadjuvant setting.  Hopefully we will see that he has a good response.  We start will be able to tell if he has a good response despite physical exam. ? ?I spent a good 60 minutes with he and his wife.  I answered their questions.  They agree with my recommendations.  Again, it was incredibly fun talking to them about [place]. ?  ?

## 2021-10-09 ENCOUNTER — Encounter: Payer: Self-pay | Admitting: Hematology & Oncology

## 2021-10-09 ENCOUNTER — Encounter: Payer: Self-pay | Admitting: *Deleted

## 2021-10-09 DIAGNOSIS — N6341 Unspecified lump in right breast, subareolar: Secondary | ICD-10-CM | POA: Diagnosis not present

## 2021-10-09 DIAGNOSIS — C50121 Malignant neoplasm of central portion of right male breast: Secondary | ICD-10-CM | POA: Diagnosis not present

## 2021-10-09 DIAGNOSIS — Z6841 Body Mass Index (BMI) 40.0 and over, adult: Secondary | ICD-10-CM | POA: Diagnosis not present

## 2021-10-09 DIAGNOSIS — Z170 Estrogen receptor positive status [ER+]: Secondary | ICD-10-CM | POA: Diagnosis not present

## 2021-10-09 LAB — CANCER ANTIGEN 27.29: CA 27.29: 7.5 U/mL (ref 0.0–38.6)

## 2021-10-09 LAB — CEA (IN HOUSE-CHCC): CEA (CHCC-In House): 1.62 ng/mL (ref 0.00–5.00)

## 2021-10-09 NOTE — Progress Notes (Signed)
Patient seen by Dr Marin Olp in the office yesterday for his initial [facility] visit. His prognostics came back as ? ?ER/PR + HER2 -  ? ?He will see Dr Adair Laundry today. He is scheduled for a PET on 3/24. He will likely need neoadjuvant treatment. This will depend on PET scan results. Dr Edgar Frisk will place port. ? ?Oncology Nurse Navigator Documentation ? ?Oncology Nurse Navigator Flowsheets 10/09/2021  ?Abnormal Finding Date -  ?Confirmed Diagnosis Date -  ?Diagnosis Status -  ?Navigator Follow Up Date: 10/16/2021  ?Navigator Follow Up Reason: Scan Review  ?Navigator Location [facility]  ?Referral Date to RadOnc/MedOnc -  ?Navigator Encounter Type Appt/Treatment Plan Review  ?Patient Visit Type MedOnc  ?Treatment Phase Pre-Tx/Tx Discussion  ?Barriers/Navigation Needs Coordination of Care;Education  ?Education -  ?Interventions None Required  ?Acuity Level 2-Minimal Needs (1-2 Barriers Identified)  ?Coordination of Care -  ?Education Method -  ?Support Groups/Services Friends and Family  ?Time Spent with Patient 30  ?   ?

## 2021-10-13 ENCOUNTER — Encounter: Payer: Self-pay | Admitting: *Deleted

## 2021-10-13 NOTE — Progress Notes (Signed)
Patient was seen by Dr Adair Laundry on 10/09/21. He is scheduled for PET at [facility] on 10/16/2021 and port placement on 10/22/21. Dr Marin Olp would like him to come in to review chemo treatment.  ? ?Called and scheduled patient for 10/19/2021. He is aware of appointment time and date. Will also schedule for chemo ed after his appointment.  ? ?Oncology Nurse Navigator Documentation ? ?Oncology Nurse Navigator Flowsheets 10/13/2021  ?Abnormal Finding Date -  ?Confirmed Diagnosis Date -  ?Diagnosis Status -  ?Navigator Follow Up Date: 10/16/2021  ?Navigator Follow Up Reason: Scan Review  ?Navigator Location [facility]  ?Referral Date to RadOnc/MedOnc -  ?Navigator Encounter Type Appt/Treatment Plan Review;Telephone  ?Telephone Appt Confirmation/Clarification;Outgoing Call  ?Patient Visit Type MedOnc  ?Treatment Phase Pre-Tx/Tx Discussion  ?Barriers/Navigation Needs Coordination of Care;Education  ?Education Other  ?Interventions Coordination of Care;Education  ?Acuity Level 2-Minimal Needs (1-2 Barriers Identified)  ?Coordination of Care Appts  ?Education Method Verbal  ?Support Groups/Services Friends and Family  ?Time Spent with Patient 30  ?  ?

## 2021-10-14 ENCOUNTER — Telehealth: Payer: Self-pay | Admitting: Family Medicine

## 2021-10-14 NOTE — Telephone Encounter (Signed)
FMLA faxed in  ? ?Placed in wendling bin up front ?

## 2021-10-15 NOTE — Telephone Encounter (Signed)
FMLA paperwork was grab and pt has appt on 10/19/21 ?

## 2021-10-16 DIAGNOSIS — I517 Cardiomegaly: Secondary | ICD-10-CM | POA: Diagnosis not present

## 2021-10-16 DIAGNOSIS — N6341 Unspecified lump in right breast, subareolar: Secondary | ICD-10-CM | POA: Diagnosis not present

## 2021-10-16 DIAGNOSIS — M47816 Spondylosis without myelopathy or radiculopathy, lumbar region: Secondary | ICD-10-CM | POA: Diagnosis not present

## 2021-10-16 DIAGNOSIS — C50921 Malignant neoplasm of unspecified site of right male breast: Secondary | ICD-10-CM | POA: Diagnosis not present

## 2021-10-19 ENCOUNTER — Other Ambulatory Visit: Payer: Self-pay | Admitting: Family Medicine

## 2021-10-19 ENCOUNTER — Other Ambulatory Visit: Payer: Self-pay | Admitting: *Deleted

## 2021-10-19 ENCOUNTER — Encounter: Payer: Self-pay | Admitting: Family Medicine

## 2021-10-19 ENCOUNTER — Encounter: Payer: Self-pay | Admitting: *Deleted

## 2021-10-19 ENCOUNTER — Other Ambulatory Visit: Payer: Self-pay

## 2021-10-19 ENCOUNTER — Encounter: Payer: Self-pay | Admitting: Hematology & Oncology

## 2021-10-19 ENCOUNTER — Other Ambulatory Visit: Payer: BC Managed Care – PPO

## 2021-10-19 ENCOUNTER — Inpatient Hospital Stay: Payer: BC Managed Care – PPO | Admitting: Hematology & Oncology

## 2021-10-19 ENCOUNTER — Ambulatory Visit: Payer: BC Managed Care – PPO | Admitting: Family Medicine

## 2021-10-19 VITALS — BP 136/82 | HR 81 | Temp 98.4°F | Wt 340.0 lb

## 2021-10-19 DIAGNOSIS — E119 Type 2 diabetes mellitus without complications: Secondary | ICD-10-CM | POA: Diagnosis not present

## 2021-10-19 DIAGNOSIS — Z801 Family history of malignant neoplasm of trachea, bronchus and lung: Secondary | ICD-10-CM | POA: Diagnosis not present

## 2021-10-19 DIAGNOSIS — E1169 Type 2 diabetes mellitus with other specified complication: Secondary | ICD-10-CM

## 2021-10-19 DIAGNOSIS — B353 Tinea pedis: Secondary | ICD-10-CM | POA: Diagnosis not present

## 2021-10-19 DIAGNOSIS — I10 Essential (primary) hypertension: Secondary | ICD-10-CM | POA: Diagnosis not present

## 2021-10-19 DIAGNOSIS — C50921 Malignant neoplasm of unspecified site of right male breast: Secondary | ICD-10-CM | POA: Diagnosis not present

## 2021-10-19 DIAGNOSIS — E669 Obesity, unspecified: Secondary | ICD-10-CM

## 2021-10-19 DIAGNOSIS — Z7984 Long term (current) use of oral hypoglycemic drugs: Secondary | ICD-10-CM | POA: Diagnosis not present

## 2021-10-19 DIAGNOSIS — C773 Secondary and unspecified malignant neoplasm of axilla and upper limb lymph nodes: Secondary | ICD-10-CM

## 2021-10-19 DIAGNOSIS — E782 Mixed hyperlipidemia: Secondary | ICD-10-CM

## 2021-10-19 DIAGNOSIS — Z79899 Other long term (current) drug therapy: Secondary | ICD-10-CM | POA: Diagnosis not present

## 2021-10-19 DIAGNOSIS — Z8041 Family history of malignant neoplasm of ovary: Secondary | ICD-10-CM | POA: Diagnosis not present

## 2021-10-19 DIAGNOSIS — C50021 Malignant neoplasm of nipple and areola, right male breast: Secondary | ICD-10-CM | POA: Diagnosis not present

## 2021-10-19 HISTORY — DX: Malignant neoplasm of unspecified site of right male breast: C77.3

## 2021-10-19 LAB — LIPID PANEL
Cholesterol: 169 mg/dL (ref 0–200)
HDL: 47.4 mg/dL (ref >39.00)
LDL Cholesterol: 92 mg/dL (ref 0–99)
NonHDL: 121.99
Total CHOL/HDL Ratio: 4
Triglycerides: 152 mg/dL — ABNORMAL HIGH (ref 0.0–149.0)
VLDL: 30.4 mg/dL (ref 0.0–40.0)

## 2021-10-19 LAB — MICROALBUMIN / CREATININE URINE RATIO
Creatinine,U: 83.9 mg/dL
Microalb Creat Ratio: 1.4 mg/g (ref 0.0–30.0)
Microalb, Ur: 1.2 mg/dL (ref 0.0–1.9)

## 2021-10-19 LAB — COMPREHENSIVE METABOLIC PANEL
ALT: 38 U/L (ref 0–53)
AST: 19 U/L (ref 0–37)
Albumin: 4.5 g/dL (ref 3.5–5.2)
Alkaline Phosphatase: 51 U/L (ref 39–117)
BUN: 16 mg/dL (ref 6–23)
CO2: 30 mEq/L (ref 19–32)
Calcium: 9.9 mg/dL (ref 8.4–10.5)
Chloride: 98 mEq/L (ref 96–112)
Creatinine, Ser: 0.95 mg/dL (ref 0.40–1.50)
GFR: 89.36 mL/min (ref >60.00)
Glucose, Bld: 127 mg/dL — ABNORMAL HIGH (ref 70–99)
Potassium: 4.4 mEq/L (ref 3.5–5.1)
Sodium: 135 mEq/L (ref 135–145)
Total Bilirubin: 0.5 mg/dL (ref 0.2–1.2)
Total Protein: 7.3 g/dL (ref 6.0–8.3)

## 2021-10-19 LAB — HEMOGLOBIN A1C: Hgb A1c MFr Bld: 7.3 % — ABNORMAL HIGH (ref 4.6–6.5)

## 2021-10-19 MED ORDER — PROCHLORPERAZINE MALEATE 10 MG PO TABS: 10.0000 mg | ORAL_TABLET | Freq: Four times a day (QID) | ORAL | 1 refills | Status: DC | PRN

## 2021-10-19 MED ORDER — ONDANSETRON HCL 8 MG PO TABS: 8.0000 mg | ORAL_TABLET | Freq: Two times a day (BID) | ORAL | 1 refills | Status: DC | PRN

## 2021-10-19 MED ORDER — CIPROFLOXACIN HCL 500 MG PO TABS: 500.0000 mg | ORAL_TABLET | Freq: Every day | ORAL | 3 refills | Status: DC

## 2021-10-19 MED ORDER — DEXAMETHASONE 4 MG PO TABS: 4.0000 mg | ORAL_TABLET | Freq: Two times a day (BID) | ORAL | 0 refills | Status: DC

## 2021-10-19 MED ORDER — DEXAMETHASONE 4 MG PO TABS: 8.0000 mg | ORAL_TABLET | Freq: Two times a day (BID) | ORAL | 1 refills | Status: DC

## 2021-10-19 MED ORDER — PIOGLITAZONE HCL 30 MG PO TABS: 30.0000 mg | ORAL_TABLET | Freq: Every day | ORAL | 3 refills | Status: DC

## 2021-10-19 NOTE — Patient Instructions (Addendum)
Give us 2-3 business days to get the results of your labs back.   Keep the diet clean and stay active.  Aim to do some physical exertion for 150 minutes per week. This is typically divided into 5 days per week, 30 minutes per day. The activity should be enough to get your heart rate up. Anything is better than nothing if you have time constraints.  Let us know if you need anything.  

## 2021-10-19 NOTE — Progress Notes (Signed)
?Hematology and Oncology Follow Up Visit ? ?Noah Taylor ?[id number] ?1966/03/27 56 y.o. ?10/19/2021 ? ? 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 ?Current Therapy:   ?Neoadjuvant TAC --start on 10/26/2021 ?    ?Interim History:  Noah Taylor is back for follow-up.  I first saw him about a week or so ago.  He has a locally advanced infiltrating ductal carcinoma of the right breast.  This is in the retroareolar position. ? ?He did see Dr. Adair Taylor, of surgery.  Dr. Adair Taylor agrees with neoadjuvant therapy. ? ?Noah Taylor is in great health.  I really think that we can be aggressive.  I probably would use a triplet protocol utilizing Taxotere/Adriamycin/Cytoxan.  I think this would be appropriate.  I would give him 4 cycles of treatment. ? ?He is feeling well.  He is having his PET scan later this week.  He is going to have his port of the cath placed on Thursday. ? ?We will need to have an echocardiogram.  We will try to get that set up for this week. ? ?I went over the side effects of treatment for him.  I told him that my biggest worry was the neutropenia.  I told him that this is a very aggressive protocol that is highly active for breast cancer.  Our goal is to try to get him into a complete remission if possible.  I would like to try to clear out the axilla if we can. ? ?I think that with the TAC protocol, we should have a 90% chance of having a good response. ? ?He will need to have Neulasta after treatment. ? ?I also probably get him on prophylactic antibiotics. ? ?Overall, his performance status is ECOG 0. ? ?Medications:  ?Current Outpatient Medications:  ?  fenofibrate (TRICOR) 48 MG tablet, TAKE 1 TABLET DAILY, Disp: 90 tablet, Rfl: 3 ?  hydrochlorothiazide (HYDRODIURIL) 25 MG tablet, Take 1 tablet (25 mg total) by mouth daily., Disp: 90 tablet, Rfl: 4 ?  losartan (COZAAR) 50 MG tablet, Take 1 tablet (50 mg total) by mouth  daily., Disp: 90 tablet, Rfl: 4 ?  metFORMIN (GLUCOPHAGE) 1000 MG tablet, Take 1 tablet (1,000 mg total) by mouth 2 (two) times daily with a meal., Disp: 180 tablet, Rfl: 4 ?  pravastatin (PRAVACHOL) 20 MG tablet, Take 1 tablet (20 mg total) by mouth daily., Disp: 90 tablet, Rfl: 4 ?  FARXIGA 10 MG TABS tablet, TAKE 1 TABLET DAILY, Disp: 90 tablet, Rfl: 3 ? ?Allergies: No Known Allergies ? ?Past Medical History, Surgical history, Social history, and Family History were reviewed and updated. ? ?Review of Systems: ?Review of Systems  ?Constitutional: Negative.   ?HENT:  Negative.    ?Eyes: Negative.   ?Respiratory: Negative.    ?Cardiovascular: Negative.   ?Gastrointestinal: Negative.   ?Endocrine: Negative.   ?Genitourinary: Negative.    ?Musculoskeletal: Negative.   ?Skin: Negative.   ?Neurological: Negative.   ?Hematological: Negative.   ?Psychiatric/Behavioral: Negative.    ? ?Physical Exam: ? height is $RemoveB'5\' 8"'YbAnXWvl$  (1.727 m) and weight is 340 lb (154.2 kg) (abnormal). His oral temperature is 98.5 ?F (36.9 ?C). His blood pressure is 128/85 and his pulse is 88. His respiration is 18 and oxygen saturation is 97%.  ? ?Wt Readings from Last 3 Encounters:  ?10/19/21 (!) 340 lb (154.2 kg)  ?10/19/21 (!) 340 lb (154.2 kg)  ?10/08/21 (!) 340 lb (154.2 kg)  ? ? ?Physical Exam ?Vitals  reviewed.  ?Constitutional:   ?   Comments: Left breast is unremarkable.  Right breast shows a retroareolar mass.  This measured 5 x 4 cm.  It is firm and not mobile.  I cannot palpate any obvious adenopathy in the right axilla.  ?HENT:  ?   Head: Normocephalic and atraumatic.  ?Eyes:  ?   Pupils: Pupils are equal, round, and reactive to light.  ?Cardiovascular:  ?   Rate and Rhythm: Normal rate and regular rhythm.  ?   Heart sounds: Normal heart sounds.  ?Pulmonary:  ?   Effort: Pulmonary effort is normal.  ?   Breath sounds: Normal breath sounds.  ?Abdominal:  ?   General: Bowel sounds are normal.  ?   Palpations: Abdomen is soft.   ?Musculoskeletal:     ?   General: No tenderness or deformity. Normal range of motion.  ?   Cervical back: Normal range of motion.  ?Lymphadenopathy:  ?   Cervical: No cervical adenopathy.  ?Skin: ?   General: Skin is warm and dry.  ?   Findings: No erythema or rash.  ?Neurological:  ?   Mental Status: He is alert and oriented to person, place, and time.  ?Psychiatric:     ?   Behavior: Behavior normal.     ?   Thought Content: Thought content normal.     ?   Judgment: Judgment normal.  ? ? ? ?Lab Results  ?Component Value Date  ? WBC 10.4 10/08/2021  ? HGB 16.7 10/08/2021  ? HCT 50.3 10/08/2021  ? MCV 80.5 10/08/2021  ? PLT 329 10/08/2021  ? ?  Chemistry   ?   ?Component Value Date/Time  ? NA 135 10/19/2021 0716  ? K 4.4 10/19/2021 0716  ? CL 98 10/19/2021 0716  ? CO2 30 10/19/2021 0716  ? BUN 16 10/19/2021 0716  ? CREATININE 0.95 10/19/2021 0716  ? CREATININE 1.24 10/08/2021 1401  ?    ?Component Value Date/Time  ? CALCIUM 9.9 10/19/2021 0716  ? ALKPHOS 51 10/19/2021 0716  ? AST 19 10/19/2021 0716  ? AST 20 10/08/2021 1401  ? ALT 38 10/19/2021 0716  ? ALT 39 10/08/2021 1401  ? BILITOT 0.5 10/19/2021 0716  ? BILITOT 0.6 10/08/2021 1401  ?  ? ? ?Impression and Plan: ?Noah Taylor is a very nice 56 year old white male.  He has a infiltrating ductal carcinoma of the right breast.  This is ER positive.  It is HER2 negative. ? ?Again, we are going to try to do neoadjuvant therapy.  I think this would be appropriate.  Again our goal is to try to get him into remission.  If we do this, I think he will have a very good prognosis. ? ?Again, I gave him information sheets.  He had chemotherapy teaching with [facility]. ? ?We will try to get started a week from now. ? ?I will send in Decadron and antibiotics for him. ? ?I would like to see him back when he has his second cycle of treatment in 4 weeks. ? ?Volanda Napoleon, MD ?3/27/20232:45 PM  ?

## 2021-10-19 NOTE — Progress Notes (Signed)
Subjective:  ?CC: DM f/u ? ?Noah Taylor is a 56 y.o. male here for follow-up of diabetes.   ?Noah Taylor does not routinely check his sugars at home.  ?Patient does not require insulin.   ?Medications include: Farxiga 10 mg/d, Metformin 1000 mg bid ?Diet is fair.  ?Exercise: walking ? ?Hypertension ?Patient presents for hypertension follow up. ?He does not monitor home blood pressures. ?He is compliant with medications- losartan 50 mg/d, HCTZ 25 mg/d. ?Patient has these side effects of medication: none ?Diet/exercise as above. ?No CP or SOB ? ?Hyperlipidemia ?Patient presents for dyslipidemia follow up. ?Currently being treated with pravastatin 20 mg/d, Tricor 48 mg/d and compliance with treatment thus far has been good. ?He denies myalgias. ?Diet/exercise as above.  ?The patient is not known to have coexisting coronary artery disease. ? ?Past Medical History:  ?Diagnosis Date  ? Diabetes mellitus without complication (Noah Taylor)   ? Hyperlipidemia   ? Hypertension   ?  ? ?Related testing: ?Retinal exam: Done ?Pneumovax: done ? ?Objective:  ?BP 136/82 (BP Location: Left Arm, Cuff Size: Large)   Pulse 81   Temp 98.4 ?F (36.9 ?C) (Oral)   Wt (!) 340 lb (154.2 kg)   BMI 51.70 kg/m?  ?General:  Well developed, well nourished, in no apparent distress ?Skin: Macerated tissue between tooth #3, 3/4, and more severe L4/5 bilaterally; otherwise warm, no pallor or diaphoresis ?Head:  Normocephalic, atraumatic ?Eyes:  Pupils equal and round, sclera anicteric without injection  ?Lungs:  CTAB, no access msc use ?Cardio:  RRR, no bruits ?Musculoskeletal:  Symmetrical muscle groups noted without atrophy or deformity ?Neuro:  Sensation intact to pinprick on feet ?Psych: Age appropriate judgment and insight ? ?Assessment:  ? ?Diabetes mellitus type 2 in obese Noah Taylor) - Plan: Comprehensive metabolic panel, Lipid panel, Hemoglobin A1c, Microalbumin / creatinine urine ratio ? ?Essential hypertension ? ?Mixed hyperlipidemia ? ?Tinea pedis of  both feet ? ?Morbid obesity (Noah Taylor), Chronic  ? ?Plan:  ? ?Chronic, stable.  Continue Farxiga 10 mg daily, metformin 1000 mg twice daily.  Counseled on diet and exercise. ?Chronic, unsure if stable.  Continue losartan 50 mg daily, hydrochlorothiazide 25 mg daily.  Monitor blood pressure at home over the next month.  He will let me know if it is elevated. ?Chronic, probably stable.  Continue Tricor 48 mg daily, pravastatin 20 mg daily. ?He has ketoconazole at home, will use for 6 weeks and let me know if he needs a refill. ?F/u in 6 mo for physical or as needed. ?The patient voiced understanding and agreement to the plan. ? ?Noah Pal, DO ?10/19/21 ?7:22 AM ? ?

## 2021-10-19 NOTE — Progress Notes (Signed)
Patient is here to discuss treatment. He is scheduled to have a port placed on 10/22/21. He had a PET that was completed on 10/16/21 however the results have not been posted. His genetics came back negative.  ? ?Chemo start planned for 10/26/21. Received chemo education after MD visit. ? ?PET scan results later posted. No disease outside known areas.  ? ?Oncology Nurse Navigator Documentation ? ? ?  10/19/2021  ?  2:00 PM  ?Oncology Nurse Navigator Flowsheets  ?Planned Course of Treatment Neo Chemo  ?Phase of Treatment Chemo  ?Chemotherapy Pending- Reason: Authorization  ?Navigator Follow Up Date: 10/26/2021  ?Navigator Follow Up Reason: Chemotherapy  ?Navigator Location [facility]  ?Navigator Encounter Type Appt/Treatment Plan Review  ?Patient Visit Type MedOnc  ?Treatment Phase Pre-Tx/Tx Discussion  ?Barriers/Navigation Needs Coordination of Care;Education  ?Education Other  ?Interventions Coordination of Care  ?Acuity Level 2-Minimal Needs (1-2 Barriers Identified)  ?Coordination of Care Radiology  ?Support Groups/Services Friends and Family  ?Time Spent with Patient 30  ?  ?

## 2021-10-19 NOTE — Progress Notes (Signed)
Patient in chemotherapy education class by himself after having office visit with Dr Marin Olp.  Discussed side effects of Taxotere, Adriamycin, Cytoxan  which include but are not limited to myelosuppression, decreased appetite, fatigue, fever, allergic or infusional reaction, mucositis, cardiac toxicity, cough, SOB, altered taste, nausea and vomiting, diarrhea, constipation, elevated LFTs myalgia and arthralgias, hair loss or thinning, rash, skin dryness, nail changes, peripheral neuropathy, discolored urine, delayed wound healing, mental changes (Chemo brain), increased risk of infections, weight loss.  Reviewed infusion room and office policy and procedure and phone numbers 24 hours x 7 days a week.  Reviewed when to call the office with any concerns or problems.  Scientist, clinical (histocompatibility and immunogenetics) given.  Discussed portacath insertion and EMLA cream administration.  Antiemetic protocol and chemotherapy schedule reviewed. Patient verbalized understanding of chemotherapy indications and possible side effects.  Teachback done  ?

## 2021-10-20 ENCOUNTER — Encounter: Payer: Self-pay | Admitting: Hematology & Oncology

## 2021-10-20 ENCOUNTER — Encounter: Payer: Self-pay | Admitting: *Deleted

## 2021-10-20 NOTE — Progress Notes (Signed)
Pharmacist Chemotherapy Monitoring - Initial Assessment   ? ?Anticipated start date: 10/26/21  ? ?The following has been reviewed per standard work regarding the patient's treatment regimen: ?The patient's diagnosis, treatment plan and drug doses, and organ/hematologic function ?Lab orders and baseline tests specific to treatment regimen  ?The treatment plan start date, drug sequencing, and pre-medications ?Prior authorization status  ?Patient's documented medication list, including drug-drug interaction screen and prescriptions for anti-emetics and supportive care specific to the treatment regimen ?The drug concentrations, fluid compatibility, administration routes, and timing of the medications to be used ?The patient's access for treatment and lifetime cumulative dose history, if applicable  ?The patient's medication allergies and previous infusion related reactions, if applicable  ? ?Changes made to treatment plan:  ?N/A ? ?Follow up needed:  ?N/A ? ?Okay to proceed 10/26/21 without Echo results per Dr. Marin Olp.  Patient is scheduled for Echo on 4/7. ? ? ?Claybon Jabs, [place], ?10/20/2021  9:00 AM  ?

## 2021-10-20 NOTE — Progress Notes (Signed)
please call him and tell him that there is no spread of the cancer. the right axillary lymph node may not be cancerous!!   ? ?Patient called and notified of results. ? ?Patient also needs an ECHO completed. Dr Marin Olp is okay with proceeding with first cycle prior to his ECHO. Scheduled for 10/30/2021. This is at [facility], entrance [address].  ? ?Patient is aware of appointment including date, time and location. No prep.  ? ?Oncology Nurse Navigator Documentation ? ? ?  10/20/2021  ? 11:30 AM  ?Oncology Nurse Navigator Flowsheets  ?Navigator Follow Up Date: 10/26/2021  ?Navigator Follow Up Reason: Chemotherapy  ?Navigator Location [facility]  ?Navigator Encounter Type Telephone;Appt/Treatment Plan Review  ?Telephone Appt Confirmation/Clarification;Diagnostic Results;Education;Outgoing Call  ?Patient Visit Type MedOnc  ?Treatment Phase Pre-Tx/Tx Discussion  ?Barriers/Navigation Needs Coordination of Care;Education  ?Education Other  ?Interventions Coordination of Care;Education;Psycho-Social Support  ?Acuity Level 2-Minimal Needs (1-2 Barriers Identified)  ?Coordination of Care Radiology  ?Education Method Verbal  ?Support Groups/Services Friends and Family  ?Time Spent with Patient 30  ? ? ?

## 2021-10-22 ENCOUNTER — Encounter: Payer: Self-pay | Admitting: Hematology & Oncology

## 2021-10-22 DIAGNOSIS — R9431 Abnormal electrocardiogram [ECG] [EKG]: Secondary | ICD-10-CM | POA: Diagnosis not present

## 2021-10-22 DIAGNOSIS — Z170 Estrogen receptor positive status [ER+]: Secondary | ICD-10-CM | POA: Diagnosis not present

## 2021-10-22 DIAGNOSIS — Z01810 Encounter for preprocedural cardiovascular examination: Secondary | ICD-10-CM | POA: Diagnosis not present

## 2021-10-22 DIAGNOSIS — E119 Type 2 diabetes mellitus without complications: Secondary | ICD-10-CM | POA: Diagnosis not present

## 2021-10-22 DIAGNOSIS — Z6841 Body Mass Index (BMI) 40.0 and over, adult: Secondary | ICD-10-CM | POA: Diagnosis not present

## 2021-10-22 DIAGNOSIS — Z7984 Long term (current) use of oral hypoglycemic drugs: Secondary | ICD-10-CM | POA: Diagnosis not present

## 2021-10-22 DIAGNOSIS — C50121 Malignant neoplasm of central portion of right male breast: Secondary | ICD-10-CM | POA: Diagnosis not present

## 2021-10-23 ENCOUNTER — Ambulatory Visit (HOSPITAL_COMMUNITY): Payer: BC Managed Care – PPO

## 2021-10-23 ENCOUNTER — Encounter: Payer: Self-pay | Admitting: Hematology & Oncology

## 2021-10-26 ENCOUNTER — Inpatient Hospital Stay: Payer: BC Managed Care – PPO

## 2021-10-26 ENCOUNTER — Inpatient Hospital Stay: Payer: BC Managed Care – PPO | Attending: Hematology & Oncology

## 2021-10-26 ENCOUNTER — Encounter: Payer: Self-pay | Admitting: Hematology & Oncology

## 2021-10-26 VITALS — BP 123/78 | HR 78 | Resp 20

## 2021-10-26 DIAGNOSIS — Z79899 Other long term (current) drug therapy: Secondary | ICD-10-CM | POA: Diagnosis not present

## 2021-10-26 DIAGNOSIS — Z5111 Encounter for antineoplastic chemotherapy: Secondary | ICD-10-CM | POA: Diagnosis not present

## 2021-10-26 DIAGNOSIS — C50921 Malignant neoplasm of unspecified site of right male breast: Secondary | ICD-10-CM

## 2021-10-26 DIAGNOSIS — Z5189 Encounter for other specified aftercare: Secondary | ICD-10-CM | POA: Diagnosis not present

## 2021-10-26 DIAGNOSIS — C773 Secondary and unspecified malignant neoplasm of axilla and upper limb lymph nodes: Secondary | ICD-10-CM | POA: Diagnosis not present

## 2021-10-26 DIAGNOSIS — Z170 Estrogen receptor positive status [ER+]: Secondary | ICD-10-CM | POA: Insufficient documentation

## 2021-10-26 DIAGNOSIS — C50021 Malignant neoplasm of nipple and areola, right male breast: Secondary | ICD-10-CM | POA: Diagnosis not present

## 2021-10-26 DIAGNOSIS — D709 Neutropenia, unspecified: Secondary | ICD-10-CM | POA: Diagnosis not present

## 2021-10-26 LAB — CMP (CANCER CENTER ONLY)
ALT: 32 U/L (ref 0–44)
AST: 18 U/L (ref 15–41)
Albumin: 4.4 g/dL (ref 3.5–5.0)
Alkaline Phosphatase: 48 U/L (ref 38–126)
Anion gap: 9 (ref 5–15)
BUN: 18 mg/dL (ref 6–20)
CO2: 27 mmol/L (ref 22–32)
Calcium: 10.2 mg/dL (ref 8.9–10.3)
Chloride: 98 mmol/L (ref 98–111)
Creatinine: 0.9 mg/dL (ref 0.61–1.24)
GFR, Estimated: >60 mL/min (ref >60)
Glucose, Bld: 103 mg/dL — ABNORMAL HIGH (ref 70–99)
Potassium: 3.7 mmol/L (ref 3.5–5.1)
Sodium: 134 mmol/L — ABNORMAL LOW (ref 135–145)
Total Bilirubin: 0.5 mg/dL (ref 0.3–1.2)
Total Protein: 7.5 g/dL (ref 6.5–8.1)

## 2021-10-26 LAB — CBC WITH DIFFERENTIAL (CANCER CENTER ONLY)
Abs Immature Granulocytes: 0.07 10*3/uL (ref 0.00–0.07)
Basophils Absolute: 0.1 10*3/uL (ref 0.0–0.1)
Basophils Relative: 0 %
Eosinophils Absolute: 0.2 10*3/uL (ref 0.0–0.5)
Eosinophils Relative: 2 %
HCT: 49.4 % (ref 39.0–52.0)
Hemoglobin: 16.5 g/dL (ref 13.0–17.0)
Immature Granulocytes: 1 %
Lymphocytes Relative: 33 %
Lymphs Abs: 3.8 10*3/uL (ref 0.7–4.0)
MCH: 26.7 pg (ref 26.0–34.0)
MCHC: 33.4 g/dL (ref 30.0–36.0)
MCV: 80.1 fL (ref 80.0–100.0)
Monocytes Absolute: 1.2 10*3/uL — ABNORMAL HIGH (ref 0.1–1.0)
Monocytes Relative: 10 %
Neutro Abs: 6.2 10*3/uL (ref 1.7–7.7)
Neutrophils Relative %: 54 %
Platelet Count: 316 10*3/uL (ref 150–400)
RBC: 6.17 MIL/uL — ABNORMAL HIGH (ref 4.22–5.81)
RDW: 13.9 % (ref 11.5–15.5)
WBC Count: 11.4 10*3/uL — ABNORMAL HIGH (ref 4.0–10.5)
nRBC: 0 % (ref 0.0–0.2)

## 2021-10-26 MED ORDER — SODIUM CHLORIDE 0.9 % IV SOLN: Freq: Once | INTRAVENOUS | Status: AC

## 2021-10-26 MED ORDER — SODIUM CHLORIDE 0.9% FLUSH
10.0000 mL | INTRAVENOUS | Status: DC | PRN
  Administered 2021-10-26: 10 mL

## 2021-10-26 MED ORDER — SODIUM CHLORIDE 0.9 % IV SOLN
10.0000 mg | Freq: Once | INTRAVENOUS | Status: DC
  Filled 2021-10-26: qty 1

## 2021-10-26 MED ORDER — DOXORUBICIN HCL CHEMO IV INJECTION 2 MG/ML
50.0000 mg/m2 | Freq: Once | INTRAVENOUS | Status: AC
  Administered 2021-10-26: 136 mg via INTRAVENOUS
  Filled 2021-10-26: qty 68

## 2021-10-26 MED ORDER — SODIUM CHLORIDE 0.9 % IV SOLN
75.0000 mg/m2 | Freq: Once | INTRAVENOUS | Status: AC
  Administered 2021-10-26: 200 mg via INTRAVENOUS
  Filled 2021-10-26: qty 20

## 2021-10-26 MED ORDER — PALONOSETRON HCL INJECTION 0.25 MG/5ML
0.2500 mg | Freq: Once | INTRAVENOUS | Status: AC
  Administered 2021-10-26: 0.25 mg via INTRAVENOUS
  Filled 2021-10-26: qty 5

## 2021-10-26 MED ORDER — SODIUM CHLORIDE 0.9 % IV SOLN
150.0000 mg | Freq: Once | INTRAVENOUS | Status: AC
  Administered 2021-10-26: 150 mg via INTRAVENOUS
  Filled 2021-10-26: qty 150

## 2021-10-26 MED ORDER — HEPARIN SOD (PORK) LOCK FLUSH 100 UNIT/ML IV SOLN
500.0000 [IU] | Freq: Once | INTRAVENOUS | Status: AC | PRN
  Administered 2021-10-26: 500 [IU]

## 2021-10-26 MED ORDER — SODIUM CHLORIDE 0.9 % IV SOLN
20.0000 mg | Freq: Once | INTRAVENOUS | Status: AC
  Administered 2021-10-26: 20 mg via INTRAVENOUS
  Filled 2021-10-26: qty 20

## 2021-10-26 MED ORDER — SODIUM CHLORIDE 0.9 % IV SOLN
500.0000 mg/m2 | Freq: Once | INTRAVENOUS | Status: AC
  Administered 2021-10-26: 1360 mg via INTRAVENOUS
  Filled 2021-10-26: qty 68

## 2021-10-26 NOTE — Progress Notes (Signed)
Pt states that he did not take the oral Decadron at home, two days prior to receiving chemotherapy.  Dr. Marin Olp notified and order received for pt to get 20 mg of IV Decadron today. Oral decadron instructions reviewed with pt.  Teach back done.  ?

## 2021-10-26 NOTE — Patient Instructions (Signed)
[facility]  Discharge Instructions: ?Thank you for choosing [facility] to provide your oncology and hematology care.  ? ?If you have a lab appointment with the [facility], please go directly to the [facility] and check in at the registration area. ? ?Wear comfortable clothing and clothing appropriate for easy access to any Portacath or PICC line.  ? ?We strive to give you quality time with your provider. You may need to reschedule your appointment if you arrive late (15 or more minutes).  Arriving late affects you and other patients whose appointments are after yours.  Also, if you miss three or more appointments without notifying the office, you may be dismissed from the clinic at the provider?s discretion.    ?  ?For prescription refill requests, have your pharmacy contact our office and allow 72 hours for refills to be completed.   ? ?Today you received the following chemotherapy and/or immunotherapy agents:  Adriamycin, Cytoxan and Taxotere.    ?  ?To help prevent nausea and vomiting after your treatment, we encourage you to take your nausea medication as directed. ? ?BELOW ARE SYMPTOMS THAT SHOULD BE REPORTED IMMEDIATELY: ?*FEVER GREATER THAN 100.4 F (38 ?C) OR HIGHER ?*CHILLS OR SWEATING ?*NAUSEA AND VOMITING THAT IS NOT CONTROLLED WITH YOUR NAUSEA MEDICATION ?*UNUSUAL SHORTNESS OF BREATH ?*UNUSUAL BRUISING OR BLEEDING ?*URINARY PROBLEMS (pain or burning when urinating, or frequent urination) ?*BOWEL PROBLEMS (unusual diarrhea, constipation, pain near the anus) ?TENDERNESS IN MOUTH AND THROAT WITH OR WITHOUT PRESENCE OF ULCERS (sore throat, sores in mouth, or a toothache) ?UNUSUAL RASH, SWELLING OR PAIN  ?UNUSUAL VAGINAL DISCHARGE OR ITCHING  ? ?Items with * indicate a potential emergency and should be followed up as soon as possible or go to the Emergency Department if any problems should occur. ? ?Please show the CHEMOTHERAPY ALERT CARD or IMMUNOTHERAPY ALERT  CARD at check-in to the Emergency Department and triage nurse. ?Should you have questions after your visit or need to cancel or reschedule your appointment, please contact [facility]  [phone] and follow the prompts.  Office hours are 8:00 a.m. to 4:30 p.m. Monday - Friday. Please note that voicemails left after 4:00 p.m. may not be returned until the following business day.  We are closed weekends and major holidays. You have access to a nurse at all times for urgent questions. Please call the main number to the clinic [phone] and follow the prompts. ? ?For any non-urgent questions, you may also contact your provider using MyChart. We now offer e-Visits for anyone 52 and older to request care online for non-urgent symptoms. For details visit [email or web address]. ?  ?Also download the MyChart app! Go to the app store, search "MyChart", open the app, select [facility], and log in with your MyChart username and password. ? ?Due to Covid, a mask is required upon entering the hospital/clinic. If you do not have a mask, one will be given to you upon arrival. For doctor visits, patients may have 1 support person aged 78 or older with them. For treatment visits, patients cannot have anyone with them due to current Covid guidelines and our immunocompromised population.  ?

## 2021-10-26 NOTE — Progress Notes (Signed)
OK to start treatment today without CBC and CMET results back per order of Dr. Marin Olp. OK to use CMET from 10/19/21 and CBC from 10/08/21 per Dr. Marin Olp.  ?

## 2021-10-26 NOTE — Patient Instructions (Signed)

## 2021-10-27 ENCOUNTER — Encounter: Payer: Self-pay | Admitting: *Deleted

## 2021-10-27 NOTE — Progress Notes (Signed)
Patient started neoadjuvant treatment on 10/26/2021. ? ?Oncology Nurse Navigator Documentation ? ? ?  10/27/2021  ? 10:00 AM  ?Oncology Nurse Navigator Flowsheets  ?Phase of Treatment Chemo  ?Chemotherapy Actual Start Date: 10/26/2021  ?Chemotherapy Expected End Date: 02/08/2022  ?Navigator Follow Up Date: 11/16/2021  ?Navigator Follow Up Reason: Follow-up Appointment;Chemotherapy  ?Navigator Location [facility]  ?Navigator Encounter Type Appt/Treatment Plan Review  ?Treatment Initiated Date 10/26/2021  ?Patient Visit Type MedOnc  ?Treatment Phase First Chemo Tx  ?Barriers/Navigation Needs Coordination of Care;Education  ?Interventions None Required  ?Acuity Level 2-Minimal Needs (1-2 Barriers Identified)  ?Support Groups/Services Friends and Family  ?Time Spent with Patient 15  ?  ?

## 2021-10-28 ENCOUNTER — Inpatient Hospital Stay: Payer: BC Managed Care – PPO

## 2021-10-28 VITALS — BP 121/69 | HR 88 | Temp 98.4°F | Resp 18

## 2021-10-28 DIAGNOSIS — Z5189 Encounter for other specified aftercare: Secondary | ICD-10-CM | POA: Diagnosis not present

## 2021-10-28 DIAGNOSIS — D709 Neutropenia, unspecified: Secondary | ICD-10-CM | POA: Diagnosis not present

## 2021-10-28 DIAGNOSIS — Z79899 Other long term (current) drug therapy: Secondary | ICD-10-CM | POA: Diagnosis not present

## 2021-10-28 DIAGNOSIS — Z170 Estrogen receptor positive status [ER+]: Secondary | ICD-10-CM | POA: Diagnosis not present

## 2021-10-28 DIAGNOSIS — Z5111 Encounter for antineoplastic chemotherapy: Secondary | ICD-10-CM | POA: Diagnosis not present

## 2021-10-28 DIAGNOSIS — C50921 Malignant neoplasm of unspecified site of right male breast: Secondary | ICD-10-CM

## 2021-10-28 DIAGNOSIS — C50021 Malignant neoplasm of nipple and areola, right male breast: Secondary | ICD-10-CM | POA: Diagnosis not present

## 2021-10-28 DIAGNOSIS — C773 Secondary and unspecified malignant neoplasm of axilla and upper limb lymph nodes: Secondary | ICD-10-CM | POA: Diagnosis not present

## 2021-10-28 MED ORDER — PEGFILGRASTIM-CBQV 6 MG/0.6ML ~~LOC~~ SOSY
6.0000 mg | PREFILLED_SYRINGE | Freq: Once | SUBCUTANEOUS | Status: AC
  Administered 2021-10-28: 6 mg via SUBCUTANEOUS
  Filled 2021-10-28: qty 0.6

## 2021-10-28 NOTE — Patient Instructions (Signed)

## 2021-10-29 ENCOUNTER — Encounter: Payer: Self-pay | Admitting: Hematology & Oncology

## 2021-10-30 ENCOUNTER — Telehealth: Payer: Self-pay

## 2021-10-30 ENCOUNTER — Ambulatory Visit (HOSPITAL_COMMUNITY)
Admission: RE | Admit: 2021-10-30 | Discharge: 2021-10-30 | Disposition: A | Discharge status: Home / Self Care | Payer: BC Managed Care – PPO | Source: Ambulatory Visit | Attending: Hematology & Oncology | Admitting: Hematology & Oncology

## 2021-10-30 DIAGNOSIS — C773 Secondary and unspecified malignant neoplasm of axilla and upper limb lymph nodes: Secondary | ICD-10-CM | POA: Diagnosis not present

## 2021-10-30 DIAGNOSIS — Z9221 Personal history of antineoplastic chemotherapy: Secondary | ICD-10-CM | POA: Diagnosis not present

## 2021-10-30 DIAGNOSIS — Z0189 Encounter for other specified special examinations: Secondary | ICD-10-CM

## 2021-10-30 DIAGNOSIS — C50921 Malignant neoplasm of unspecified site of right male breast: Secondary | ICD-10-CM

## 2021-10-30 LAB — ECHOCARDIOGRAM LIMITED: Area-P 1/2: 2.52 cm2

## 2021-10-30 NOTE — Telephone Encounter (Signed)
-----   Message from Volanda Napoleon, MD sent at 10/30/2021 11:43 AM EDT ----- ?Call - your heart is pumping like a champion!!!  Laurey Arrow ?

## 2021-10-30 NOTE — Progress Notes (Signed)
?  Echocardiogram ?2D Echocardiogram has been performed. ? ?Noah Taylor ?10/30/2021, 8:47 AM ?

## 2021-10-30 NOTE — Telephone Encounter (Signed)
Advised via MyChart.

## 2021-11-01 ENCOUNTER — Other Ambulatory Visit: Payer: Self-pay

## 2021-11-01 ENCOUNTER — Emergency Department (HOSPITAL_BASED_OUTPATIENT_CLINIC_OR_DEPARTMENT_OTHER): Payer: BC Managed Care – PPO

## 2021-11-01 ENCOUNTER — Encounter (HOSPITAL_BASED_OUTPATIENT_CLINIC_OR_DEPARTMENT_OTHER): Payer: Self-pay | Admitting: Emergency Medicine

## 2021-11-01 ENCOUNTER — Emergency Department (HOSPITAL_BASED_OUTPATIENT_CLINIC_OR_DEPARTMENT_OTHER)
Admission: EM | Admit: 2021-11-01 | Discharge: 2021-11-01 | Disposition: A | Discharge status: Home / Self Care | Payer: BC Managed Care – PPO | Attending: Emergency Medicine | Admitting: Emergency Medicine

## 2021-11-01 DIAGNOSIS — C50929 Malignant neoplasm of unspecified site of unspecified male breast: Secondary | ICD-10-CM | POA: Diagnosis not present

## 2021-11-01 DIAGNOSIS — Z79899 Other long term (current) drug therapy: Secondary | ICD-10-CM | POA: Diagnosis not present

## 2021-11-01 DIAGNOSIS — Z20822 Contact with and (suspected) exposure to covid-19: Secondary | ICD-10-CM | POA: Diagnosis not present

## 2021-11-01 DIAGNOSIS — E119 Type 2 diabetes mellitus without complications: Secondary | ICD-10-CM | POA: Diagnosis not present

## 2021-11-01 DIAGNOSIS — D701 Agranulocytosis secondary to cancer chemotherapy: Secondary | ICD-10-CM | POA: Diagnosis not present

## 2021-11-01 DIAGNOSIS — I10 Essential (primary) hypertension: Secondary | ICD-10-CM | POA: Insufficient documentation

## 2021-11-01 DIAGNOSIS — R509 Fever, unspecified: Secondary | ICD-10-CM | POA: Diagnosis not present

## 2021-11-01 DIAGNOSIS — Z7984 Long term (current) use of oral hypoglycemic drugs: Secondary | ICD-10-CM | POA: Diagnosis not present

## 2021-11-01 LAB — RESP PANEL BY RT-PCR (FLU A&B, COVID) ARPGX2
Influenza A by PCR: NEGATIVE
Influenza B by PCR: NEGATIVE
SARS Coronavirus 2 by RT PCR: NEGATIVE

## 2021-11-01 LAB — COMPREHENSIVE METABOLIC PANEL
ALT: 25 U/L (ref 0–44)
AST: 17 U/L (ref 15–41)
Albumin: 3.3 g/dL — ABNORMAL LOW (ref 3.5–5.0)
Alkaline Phosphatase: 40 U/L (ref 38–126)
Anion gap: 7 (ref 5–15)
BUN: 16 mg/dL (ref 6–20)
CO2: 27 mmol/L (ref 22–32)
Calcium: 8.8 mg/dL — ABNORMAL LOW (ref 8.9–10.3)
Chloride: 99 mmol/L (ref 98–111)
Creatinine, Ser: 0.72 mg/dL (ref 0.61–1.24)
GFR, Estimated: >60 mL/min (ref >60)
Glucose, Bld: 160 mg/dL — ABNORMAL HIGH (ref 70–99)
Potassium: 3.8 mmol/L (ref 3.5–5.1)
Sodium: 133 mmol/L — ABNORMAL LOW (ref 135–145)
Total Bilirubin: 0.7 mg/dL (ref 0.3–1.2)
Total Protein: 6.5 g/dL (ref 6.5–8.1)

## 2021-11-01 LAB — CBC WITH DIFFERENTIAL/PLATELET
Abs Immature Granulocytes: 0 10*3/uL (ref 0.00–0.07)
Basophils Absolute: 0 10*3/uL (ref 0.0–0.1)
Basophils Relative: 0 %
Eosinophils Absolute: 0 10*3/uL (ref 0.0–0.5)
Eosinophils Relative: 0 %
HCT: 44.4 % (ref 39.0–52.0)
Hemoglobin: 14.8 g/dL (ref 13.0–17.0)
Lymphocytes Relative: 88 %
Lymphs Abs: 1.1 10*3/uL (ref 0.7–4.0)
MCH: 26.4 pg (ref 26.0–34.0)
MCHC: 33.3 g/dL (ref 30.0–36.0)
MCV: 79.1 fL — ABNORMAL LOW (ref 80.0–100.0)
Monocytes Absolute: 0.1 10*3/uL (ref 0.1–1.0)
Monocytes Relative: 6 %
Neutro Abs: 0.1 10*3/uL — CL (ref 1.7–7.7)
Neutrophils Relative %: 6 %
Platelets: 157 10*3/uL (ref 150–400)
RBC: 5.61 MIL/uL (ref 4.22–5.81)
RDW: 13.5 % (ref 11.5–15.5)
WBC Morphology: >10
WBC: 1.3 10*3/uL — CL (ref 4.0–10.5)
nRBC: 0 % (ref 0.0–0.2)

## 2021-11-01 LAB — URINALYSIS, ROUTINE W REFLEX MICROSCOPIC
Bilirubin Urine: NEGATIVE
Glucose, UA: >=500 mg/dL — AB
Hgb urine dipstick: NEGATIVE
Ketones, ur: NEGATIVE mg/dL
Leukocytes,Ua: NEGATIVE
Nitrite: NEGATIVE
Protein, ur: NEGATIVE mg/dL
Specific Gravity, Urine: 1.015 (ref 1.005–1.030)
pH: 7.5 (ref 5.0–8.0)

## 2021-11-01 LAB — URINALYSIS, MICROSCOPIC (REFLEX)

## 2021-11-01 LAB — LIPASE, BLOOD: Lipase: 46 U/L (ref 11–51)

## 2021-11-01 LAB — LACTIC ACID, PLASMA: Lactic Acid, Venous: 1.8 mmol/L (ref 0.5–1.9)

## 2021-11-01 MED ORDER — AMOXICILLIN-POT CLAVULANATE 875-125 MG PO TABS
1.0000 | ORAL_TABLET | Freq: Once | ORAL | Status: AC
Start: 2021-11-01 — End: 2021-11-01
  Administered 2021-11-01: 1 via ORAL
  Filled 2021-11-01: qty 1

## 2021-11-01 MED ORDER — CIPROFLOXACIN HCL 500 MG PO TABS: 500.0000 mg | ORAL_TABLET | Freq: Two times a day (BID) | ORAL | 0 refills | Status: AC

## 2021-11-01 MED ORDER — CIPROFLOXACIN HCL 500 MG PO TABS
500.0000 mg | ORAL_TABLET | Freq: Once | ORAL | Status: AC
  Administered 2021-11-01: 500 mg via ORAL
  Filled 2021-11-01: qty 1

## 2021-11-01 MED ORDER — AMOXICILLIN-POT CLAVULANATE 875-125 MG PO TABS: 1.0000 | ORAL_TABLET | Freq: Two times a day (BID) | ORAL | 0 refills | Status: AC

## 2021-11-01 NOTE — ED Notes (Addendum)
Tegeler, MD notified of critical WBC 1.3 and absolute neutrophils 0.1. Primary RN, Mitzi Hansen also notified. ?

## 2021-11-01 NOTE — Discharge Instructions (Signed)
Your history, exam, work-up today showed that you are neutropenic and in the setting of your fever, the oncology team recommended increasing her Cipro to 500 mg twice a day and starting Augmentin and follow-up with Dr. Marin Olp tomorrow.  Given your lack of other symptoms and otherwise well appearance with reassuring vital signs, we feel this is a reasonable plan.  We did not find any evidence of urinary tract infection, pneumonia, or other evidence of bacterial infection.  Please rest and stay hydrated and see your oncology team tomorrow.  If any symptoms change or worsen acutely, please return to the nearest emergency department. ?

## 2021-11-01 NOTE — ED Notes (Signed)
Water provided to pt (EDP aware) ?

## 2021-11-01 NOTE — ED Triage Notes (Signed)
Started chemo last week. Fever today. Endorses headache.  ?

## 2021-11-01 NOTE — ED Provider Notes (Signed)
?[facility] EMERGENCY DEPARTMENT ?Provider Note ? ? ?CSN: [id number] ?Arrival date & time: 11/01/21  1923 ? ?  ? ?History ? ?Chief Complaint  ?Patient presents with  ? Fever  ? ? ?Noah Taylor is a 56 y.o. male. ? ?The history is provided by the patient and medical records.  ?Fever ?Max temp prior to arrival:  104 ?Temp source:  Oral ?Severity:  Severe ?Onset quality:  Gradual ?Duration:  3 hours ?Timing:  Intermittent ?Progression:  Waxing and waning ?Chronicity:  New ?Worsened by:  Nothing ?Ineffective treatments:  None tried ?Associated symptoms: chills and headaches (mild)   ?Associated symptoms: no chest pain, no confusion, no congestion, no cough, no diarrhea, no dysuria, no ear pain, no myalgias, no nausea, no rash, no rhinorrhea, no somnolence, no sore throat and no vomiting   ?Risk factors: hx of cancer and immunosuppression   ? ?  ? ?Home Medications ?Prior to Admission medications   ?Medication Sig Start Date End Date Taking? Authorizing Provider  ?ciprofloxacin (CIPRO) 500 MG tablet Take 1 tablet (500 mg total) by mouth daily with breakfast. Take 1 tablet daily for 14 days starting the day after each cycle of chemotherapy. 10/19/21   Volanda Napoleon, MD  ?dexamethasone (DECADRON) 4 MG tablet Take 2 tablets (8 mg total) by mouth 2 (two) times daily. Start the day before Taxotere. Then take daily x 3 days after chemotherapy. 10/19/21   Volanda Napoleon, MD  ?dexamethasone (DECADRON) 4 MG tablet Take 1 tablet (4 mg total) by mouth 2 (two) times daily with a meal. Take 2 tablets twice a day starting 2 days before each cycle of chemotherapy.. 10/19/21   Volanda Napoleon, MD  ?FARXIGA 10 MG TABS tablet TAKE 1 TABLET DAILY 10/19/21   Shelda Pal, DO  ?fenofibrate (TRICOR) 48 MG tablet TAKE 1 TABLET DAILY 03/23/21   Shelda Pal, DO  ?hydrochlorothiazide (HYDRODIURIL) 25 MG tablet Take 1 tablet (25 mg total) by mouth daily. 08/21/20   Shelda Pal, DO  ?losartan (COZAAR)  50 MG tablet Take 1 tablet (50 mg total) by mouth daily. 08/21/20   Shelda Pal, DO  ?metFORMIN (GLUCOPHAGE) 1000 MG tablet Take 1 tablet (1,000 mg total) by mouth 2 (two) times daily with a meal. 08/21/20   Wendling, Crosby Oyster, DO  ?ondansetron (ZOFRAN) 8 MG tablet Take 1 tablet (8 mg total) by mouth 2 (two) times daily as needed (Nausea or vomiting). Start on the third day after chemotherapy. 10/19/21   Volanda Napoleon, MD  ?pioglitazone (ACTOS) 30 MG tablet Take 1 tablet (30 mg total) by mouth daily. 10/19/21   Shelda Pal, DO  ?pravastatin (PRAVACHOL) 20 MG tablet Take 1 tablet (20 mg total) by mouth daily. 08/21/20   Shelda Pal, DO  ?prochlorperazine (COMPAZINE) 10 MG tablet Take 1 tablet (10 mg total) by mouth every 6 (six) hours as needed (Nausea or vomiting). 10/19/21   Volanda Napoleon, MD  ?   ? ?Allergies    ?Patient has no known allergies.   ? ?Review of Systems   ?Review of Systems  ?Constitutional:  Positive for chills, fatigue and fever. Negative for diaphoresis.  ?HENT:  Negative for congestion, ear pain, rhinorrhea and sore throat.   ?Respiratory:  Negative for cough, chest tightness, shortness of breath, wheezing and stridor.   ?Cardiovascular:  Negative for chest pain and palpitations.  ?Gastrointestinal:  Negative for abdominal pain, constipation, diarrhea, nausea and vomiting.  ?Genitourinary:  Positive  for frequency. Negative for dysuria and flank pain.  ?Musculoskeletal:  Negative for back pain, myalgias, neck pain and neck stiffness.  ?Skin:  Negative for rash and wound.  ?Neurological:  Positive for headaches (mild). Negative for dizziness, facial asymmetry, light-headedness and numbness.  ?Psychiatric/Behavioral:  Negative for agitation and confusion.   ?All other systems reviewed and are negative. ? ?Physical Exam ?Updated Vital Signs ?BP 140/89 (BP Location: Left Arm)   Pulse (!) 109   Temp 99.8 ?F (37.7 ?C) (Oral)   Resp (!) 24   Ht '5\' 8"'$  (1.727 m)    Wt (!) 153.3 kg   SpO2 96%   BMI 51.39 kg/m?  ?Physical Exam ?Vitals and nursing note reviewed.  ?Constitutional:   ?   General: He is not in acute distress. ?   Appearance: He is well-developed. He is not ill-appearing, toxic-appearing or diaphoretic.  ?HENT:  ?   Head: Normocephalic and atraumatic.  ?   Nose: No congestion.  ?   Mouth/Throat:  ?   Mouth: Mucous membranes are moist.  ?   Pharynx: No oropharyngeal exudate or posterior oropharyngeal erythema.  ?Eyes:  ?   Extraocular Movements: Extraocular movements intact.  ?   Conjunctiva/sclera: Conjunctivae normal.  ?   Pupils: Pupils are equal, round, and reactive to light.  ?Cardiovascular:  ?   Rate and Rhythm: Regular rhythm. Tachycardia present.  ?   Heart sounds: No murmur heard. ?Pulmonary:  ?   Effort: Pulmonary effort is normal. No respiratory distress.  ?   Breath sounds: Normal breath sounds. No wheezing, rhonchi or rales.  ?Chest:  ?   Chest wall: No tenderness.  ?Abdominal:  ?   General: Abdomen is flat.  ?   Palpations: Abdomen is soft.  ?   Tenderness: There is no abdominal tenderness. There is no right CVA tenderness, left CVA tenderness, guarding or rebound.  ?Musculoskeletal:     ?   General: No swelling or tenderness.  ?   Cervical back: Neck supple. No tenderness.  ?   Right lower leg: No edema.  ?   Left lower leg: No edema.  ?Skin: ?   General: Skin is warm and dry.  ?   Capillary Refill: Capillary refill takes less than 2 seconds.  ?   Findings: No erythema.  ?Neurological:  ?   General: No focal deficit present.  ?   Mental Status: He is alert. Mental status is at baseline.  ?Psychiatric:     ?   Mood and Affect: Mood normal.  ? ? ?ED Results / Procedures / Treatments   ?Labs ?(all labs ordered are listed, but only abnormal results are displayed) ?Labs Reviewed  ?CBC WITH DIFFERENTIAL/PLATELET - Abnormal; Notable for the following components:  ?    Result Value  ? WBC 1.3 (*)   ? MCV 79.1 (*)   ? Neutro Abs 0.1 (*)   ? All other  components within normal limits  ?COMPREHENSIVE METABOLIC PANEL - Abnormal; Notable for the following components:  ? Sodium 133 (*)   ? Glucose, Bld 160 (*)   ? Calcium 8.8 (*)   ? Albumin 3.3 (*)   ? All other components within normal limits  ?URINALYSIS, ROUTINE W REFLEX MICROSCOPIC - Abnormal; Notable for the following components:  ? Glucose, UA >=500 (*)   ? All other components within normal limits  ?URINALYSIS, MICROSCOPIC (REFLEX) - Abnormal; Notable for the following components:  ? Bacteria, UA RARE (*)   ? All other  components within normal limits  ?RESP PANEL BY RT-PCR (FLU A&B, COVID) [id number]  ?CULTURE, BLOOD (ROUTINE X 2)  ?URINE CULTURE  ?CULTURE, BLOOD (ROUTINE X 2)  ?LACTIC ACID, PLASMA  ?LIPASE, BLOOD  ?LACTIC ACID, PLASMA  ?PATHOLOGIST SMEAR REVIEW  ? ? ?EKG ?None ? ?Radiology ?DG Chest Portable 1 View ? ?Result Date: 11/01/2021 ?CLINICAL DATA:  Fevers EXAM: PORTABLE CHEST 1 VIEW COMPARISON:  None. FINDINGS: Cardiac shadow is within normal limits. Left-sided chest wall port is noted. The lungs are clear. No bony abnormality is noted. IMPRESSION: No active disease. Electronically Signed   By: Inez Catalina M.D.   On: 11/01/2021 20:36   ? ?Procedures ?Procedures  ? ? ?Medications Ordered in ED ?Medications  ?amoxicillin-clavulanate (AUGMENTIN) 875-125 MG per tablet 1 tablet (1 tablet Oral Given 11/01/21 2230)  ?ciprofloxacin (CIPRO) tablet 500 mg (500 mg Oral Given 11/01/21 2230)  ? ? ?ED Course/ Medical Decision Making/ A&P ?  ?                        ?Medical Decision Making ?Amount and/or Complexity of Data Reviewed ?Labs: ordered. ?Radiology: ordered. ? ? ?Mohanad Carsten is a 56 y.o. male with a past medical history significant for hypertension, hyperlipidemia, obesity, diabetes, and breast cancer status post infusion chemotherapy last week who presents with fever.  According to patient, earlier today he was feeling tired and then at about 6:30 PM started having fever.  He reports his fever went up to  104 at home prompting him to come in for evaluation.  He did take Tylenol before arriving and fever has resolved.  He otherwise is denying chest pain, shortness of breath, cough, neck pain, neck stiffness, dysuri

## 2021-11-02 ENCOUNTER — Encounter: Payer: Self-pay | Admitting: Hematology & Oncology

## 2021-11-02 ENCOUNTER — Inpatient Hospital Stay (HOSPITAL_BASED_OUTPATIENT_CLINIC_OR_DEPARTMENT_OTHER): Payer: BC Managed Care – PPO | Admitting: Hematology & Oncology

## 2021-11-02 ENCOUNTER — Other Ambulatory Visit: Payer: Self-pay

## 2021-11-02 ENCOUNTER — Inpatient Hospital Stay: Payer: BC Managed Care – PPO

## 2021-11-02 ENCOUNTER — Telehealth: Payer: Self-pay | Admitting: *Deleted

## 2021-11-02 VITALS — BP 128/76 | HR 83 | Temp 98.9°F | Resp 18 | Wt 346.0 lb

## 2021-11-02 DIAGNOSIS — D709 Neutropenia, unspecified: Secondary | ICD-10-CM | POA: Diagnosis not present

## 2021-11-02 DIAGNOSIS — Z170 Estrogen receptor positive status [ER+]: Secondary | ICD-10-CM | POA: Diagnosis not present

## 2021-11-02 DIAGNOSIS — Z5111 Encounter for antineoplastic chemotherapy: Secondary | ICD-10-CM | POA: Diagnosis not present

## 2021-11-02 DIAGNOSIS — C50921 Malignant neoplasm of unspecified site of right male breast: Secondary | ICD-10-CM | POA: Diagnosis not present

## 2021-11-02 DIAGNOSIS — C773 Secondary and unspecified malignant neoplasm of axilla and upper limb lymph nodes: Secondary | ICD-10-CM

## 2021-11-02 DIAGNOSIS — Z79899 Other long term (current) drug therapy: Secondary | ICD-10-CM | POA: Diagnosis not present

## 2021-11-02 DIAGNOSIS — Z5189 Encounter for other specified aftercare: Secondary | ICD-10-CM | POA: Diagnosis not present

## 2021-11-02 DIAGNOSIS — C50021 Malignant neoplasm of nipple and areola, right male breast: Secondary | ICD-10-CM | POA: Diagnosis not present

## 2021-11-02 LAB — CMP (CANCER CENTER ONLY)
ALT: 19 U/L (ref 0–44)
AST: 10 U/L — ABNORMAL LOW (ref 15–41)
Albumin: 3.8 g/dL (ref 3.5–5.0)
Alkaline Phosphatase: 40 U/L (ref 38–126)
Anion gap: 7 (ref 5–15)
BUN: 16 mg/dL (ref 6–20)
CO2: 30 mmol/L (ref 22–32)
Calcium: 9.3 mg/dL (ref 8.9–10.3)
Chloride: 99 mmol/L (ref 98–111)
Creatinine: 0.75 mg/dL (ref 0.61–1.24)
GFR, Estimated: >60 mL/min (ref >60)
Glucose, Bld: 138 mg/dL — ABNORMAL HIGH (ref 70–99)
Potassium: 3.6 mmol/L (ref 3.5–5.1)
Sodium: 136 mmol/L (ref 135–145)
Total Bilirubin: 0.4 mg/dL (ref 0.3–1.2)
Total Protein: 6.2 g/dL — ABNORMAL LOW (ref 6.5–8.1)

## 2021-11-02 LAB — CBC WITH DIFFERENTIAL (CANCER CENTER ONLY)
Abs Immature Granulocytes: 0.03 10*3/uL (ref 0.00–0.07)
Basophils Absolute: 0 10*3/uL (ref 0.0–0.1)
Basophils Relative: 1 %
Eosinophils Absolute: 0 10*3/uL (ref 0.0–0.5)
Eosinophils Relative: 1 %
HCT: 45.1 % (ref 39.0–52.0)
Hemoglobin: 14.9 g/dL (ref 13.0–17.0)
Immature Granulocytes: 1 %
Lymphocytes Relative: 65 %
Lymphs Abs: 1.5 10*3/uL (ref 0.7–4.0)
MCH: 26.5 pg (ref 26.0–34.0)
MCHC: 33 g/dL (ref 30.0–36.0)
MCV: 80.1 fL (ref 80.0–100.0)
Monocytes Absolute: 0.4 10*3/uL (ref 0.1–1.0)
Monocytes Relative: 19 %
Neutro Abs: 0.3 10*3/uL — CL (ref 1.7–7.7)
Neutrophils Relative %: 13 %
Platelet Count: 160 10*3/uL (ref 150–400)
RBC: 5.63 MIL/uL (ref 4.22–5.81)
RDW: 13.4 % (ref 11.5–15.5)
Smear Review: NORMAL
WBC Count: 2.4 10*3/uL — ABNORMAL LOW (ref 4.0–10.5)
nRBC: 0 % (ref 0.0–0.2)

## 2021-11-02 LAB — PATHOLOGIST SMEAR REVIEW

## 2021-11-02 NOTE — Patient Instructions (Signed)
Implanted Port Removal ?Implanted port removal is a procedure to remove the port and catheter that are implanted under your skin. The port is a small disc under your skin that can be punctured with a needle. It is connected to a vein in your chest or neck by a small, thin tube (catheter). The implanted port is used to give medicines for treatments, and it may also be used to take blood samples. ?Your health care provider will remove the implanted port if: ?You no longer need it for treatment. ?It is not working properly. ?The area around it gets infected. ?Tell a health care provider about: ?Any allergies you have. ?All medicines you are taking, including vitamins, herbs, eye drops, creams, and over-the-counter medicines. ?Any problems you or family members have had with anesthetic medicines. ?Any bleeding problems you have. ?Any surgeries you have had. ?Any medical conditions you have. ?Whether you are pregnant or may be pregnant. ?What are the risks? ?Generally, this is a safe procedure. However, problems may occur, including: ?Infection. ?Bleeding. ?Allergic reactions to anesthetic medicines. ?Damage to nerves or blood vessels. ?What happens before the procedure? ?Medicines ?Ask your health care provider about: ?Changing or stopping your regular medicines. This is especially important if you are taking diabetes medicines or blood thinners. ?Taking medicines such as aspirin and ibuprofen. These medicines can thin your blood. Do not take these medicines unless your health care provider tells you to take them. ?Taking over-the-counter medicines, vitamins, herbs, and supplements. ?Tests ?You will have: ?A physical exam. ?Blood tests. ?Imaging tests, including a chest X-ray. ?General instructions ?Follow instructions from your health care provider about eating or drinking restrictions. ?Ask your health care provider: ?How your surgery site will be marked. ?What steps will be taken to help prevent infection. These  steps may include: ?Removing hair at the surgery site. ?Washing skin with a germ-killing soap. ?Taking antibiotic medicine. ?If you will be going home right after the procedure, plan to have a responsible adult: ?Take you home from the hospital or clinic. You will not be allowed to drive. ?Care for you for the time you are told. ?What happens during the procedure? ?You may be given one or more of the following: ?A medicine to help you relax (sedative). ?A medicine to numb the area (local anesthetic). ?A small incision will be made at the site of your implanted port. ?The implanted port and the catheter that has been inside your vein will be gently removed. ?The port and catheter will be inspected to make sure that all the parts have been removed. Part of the catheter may be tested for bacteria. ?The incision will be closed with stitches (sutures), adhesive strips, or skin glue. ?A bandage (dressing) will be placed over the incision. The health care provider may apply gentle pressure over the dressing for about 5 minutes. ?The procedure may vary among health care providers and hospitals. ?What happens after the procedure? ?Your blood pressure, heart rate, breathing rate, and blood oxygen level will be monitored until you leave the hospital or clinic. ?You will be monitored to make sure that there is no bleeding from the site where the port was removed. ?If you were given a sedative during the procedure, it can affect you for several hours. Do not drive or operate machinery until your health care provider says that it is safe. ?Summary ?Implanted port removal is a procedure to remove the port and catheter that are implanted under your skin. ?Before the procedure, follow your health care  provider's instructions about changing or stopping your regular medicines. This is especially important if you are taking diabetes medicines or blood thinners. ?If you will be going home right after the procedure, plan to have a  responsible adult care for you for the time you are told. ?This information is not intended to replace advice given to you by your health care provider. Make sure you discuss any questions you have with your health care provider. ?Document Revised: 01/13/2021 Document Reviewed: 01/13/2021 ?Elsevier Patient Education ? [place]. ? ?

## 2021-11-02 NOTE — Telephone Encounter (Signed)
Critical ANC . 3 reported by Judeen Hammans in the lab.  Dr Marin Olp notified and seeing patient in exam room at this time.  No further orders given ?

## 2021-11-02 NOTE — Progress Notes (Signed)
?Hematology and Oncology Follow Up Visit ? ?Noah Taylor ?[id number] ?12-Oct-1965 56 y.o. ?11/02/2021 ? ? 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 ?Current Therapy:   ?Neoadjuvant TAC --s/p cycle #1 - start on 10/26/2021 ?    ?Interim History:  Noah Taylor is back for an early follow-up.  He went to the emergency room yesterday.  He was neutropenic.  His ANC was 100.  He had a temperature of 103 degrees.  He had a very thorough work-up.  He had blood cultures done.  He had urine culture done.  So far, these results are negative.  He had a chest x-ray done which was also unremarkable. ? ?He was already on prophylactic ciprofloxacin.  They put him on twice a day ciprofloxacin along with Augmentin. ? ?He did have a urinalysis done which was unremarkable. ? ?He has had no diarrhea.  He has had no vomiting.  There has been no mouth sores.  He has had little bit of "tingling" where he has the breast mass over on the right side. ? ?There is been no rashes.  He has had no leg swelling. ? ?Overall, his performance status is ECOG 1. ? ? ?Medications:  ?Current Outpatient Medications:  ?  amoxicillin-clavulanate (AUGMENTIN) 875-125 MG tablet, Take 1 tablet by mouth every 12 (twelve) hours for 10 days., Disp: 20 tablet, Rfl: 0 ?  ciprofloxacin (CIPRO) 500 MG tablet, Take 1 tablet (500 mg total) by mouth 2 (two) times daily for 10 days., Disp: 20 tablet, Rfl: 0 ?  dexamethasone (DECADRON) 4 MG tablet, Take 2 tablets (8 mg total) by mouth 2 (two) times daily. Start the day before Taxotere. Then take daily x 3 days after chemotherapy., Disp: 30 tablet, Rfl: 1 ?  dexamethasone (DECADRON) 4 MG tablet, Take 1 tablet (4 mg total) by mouth 2 (two) times daily with a meal. Take 2 tablets twice a day starting 2 days before each cycle of chemotherapy.., Disp: 40 tablet, Rfl: 0 ?  FARXIGA 10 MG TABS tablet, TAKE 1 TABLET DAILY, Disp: 90 tablet, Rfl: 3 ?   fenofibrate (TRICOR) 48 MG tablet, TAKE 1 TABLET DAILY, Disp: 90 tablet, Rfl: 3 ?  hydrochlorothiazide (HYDRODIURIL) 25 MG tablet, Take 1 tablet (25 mg total) by mouth daily., Disp: 90 tablet, Rfl: 4 ?  losartan (COZAAR) 50 MG tablet, Take 1 tablet (50 mg total) by mouth daily., Disp: 90 tablet, Rfl: 4 ?  metFORMIN (GLUCOPHAGE) 1000 MG tablet, Take 1 tablet (1,000 mg total) by mouth 2 (two) times daily with a meal., Disp: 180 tablet, Rfl: 4 ?  ondansetron (ZOFRAN) 8 MG tablet, Take 1 tablet (8 mg total) by mouth 2 (two) times daily as needed (Nausea or vomiting). Start on the third day after chemotherapy., Disp: 30 tablet, Rfl: 1 ?  pioglitazone (ACTOS) 30 MG tablet, Take 1 tablet (30 mg total) by mouth daily., Disp: 30 tablet, Rfl: 3 ?  pravastatin (PRAVACHOL) 20 MG tablet, Take 1 tablet (20 mg total) by mouth daily., Disp: 90 tablet, Rfl: 4 ?  prochlorperazine (COMPAZINE) 10 MG tablet, Take 1 tablet (10 mg total) by mouth every 6 (six) hours as needed (Nausea or vomiting)., Disp: 30 tablet, Rfl: 1 ? ?Allergies: No Known Allergies ? ?Past Medical History, Surgical history, Social history, and Family History were reviewed and updated. ? ?Review of Systems: ?Review of Systems  ?Constitutional: Negative.   ?HENT:  Negative.    ?Eyes: Negative.   ?Respiratory: Negative.    ?  Cardiovascular: Negative.   ?Gastrointestinal: Negative.   ?Endocrine: Negative.   ?Genitourinary: Negative.    ?Musculoskeletal: Negative.   ?Skin: Negative.   ?Neurological: Negative.   ?Hematological: Negative.   ?Psychiatric/Behavioral: Negative.    ? ?Physical Exam: ? weight is 346 lb (156.9 kg) (abnormal). His oral temperature is 98.9 ?F (37.2 ?C). His blood pressure is 128/76 and his pulse is 83. His respiration is 18 and oxygen saturation is 99%.  ? ?Wt Readings from Last 3 Encounters:  ?11/02/21 (!) 346 lb (156.9 kg)  ?11/01/21 (!) 338 lb (153.3 kg)  ?10/19/21 (!) 340 lb (154.2 kg)  ? ? ?Physical Exam ?Vitals reviewed.  ?Constitutional:   ?    Comments: Left breast is unremarkable.  Right breast shows a retroareolar mass.  This measured 5 x 4 cm.  It is firm and not mobile.  I cannot palpate any obvious adenopathy in the right axilla.  ?HENT:  ?   Head: Normocephalic and atraumatic.  ?Eyes:  ?   Pupils: Pupils are equal, round, and reactive to light.  ?Cardiovascular:  ?   Rate and Rhythm: Normal rate and regular rhythm.  ?   Heart sounds: Normal heart sounds.  ?Pulmonary:  ?   Effort: Pulmonary effort is normal.  ?   Breath sounds: Normal breath sounds.  ?Abdominal:  ?   General: Bowel sounds are normal.  ?   Palpations: Abdomen is soft.  ?Musculoskeletal:     ?   General: No tenderness or deformity. Normal range of motion.  ?   Cervical back: Normal range of motion.  ?Lymphadenopathy:  ?   Cervical: No cervical adenopathy.  ?Skin: ?   General: Skin is warm and dry.  ?   Findings: No erythema or rash.  ?Neurological:  ?   Mental Status: He is alert and oriented to person, place, and time.  ?Psychiatric:     ?   Behavior: Behavior normal.     ?   Thought Content: Thought content normal.     ?   Judgment: Judgment normal.  ? ? ? ?Lab Results  ?Component Value Date  ? WBC 2.4 (L) 11/02/2021  ? HGB 14.9 11/02/2021  ? HCT 45.1 11/02/2021  ? MCV 80.1 11/02/2021  ? PLT 160 11/02/2021  ? ?  Chemistry   ?   ?Component Value Date/Time  ? NA 136 11/02/2021 1230  ? K 3.6 11/02/2021 1230  ? CL 99 11/02/2021 1230  ? CO2 30 11/02/2021 1230  ? BUN 16 11/02/2021 1230  ? CREATININE 0.75 11/02/2021 1230  ?    ?Component Value Date/Time  ? CALCIUM 9.3 11/02/2021 1230  ? ALKPHOS 40 11/02/2021 1230  ? AST 10 (L) 11/02/2021 1230  ? ALT 19 11/02/2021 1230  ? BILITOT 0.4 11/02/2021 1230  ?  ? ? ?Impression and Plan: ?Noah Taylor is a very nice 56 year old white male.  He had 1 cycle of neoadjuvant chemotherapy.  The right breast mass may be a little bit smaller. ? ?He was profoundly neutropenic.  His white cell count is coming up today.  I do not have the differential as of  yet. ? ?I think going to have to make a dosage adjustment to his protocol.  ? ?For right now, he looks quite good.  Again I think his blood counts are starting to come up which is positive. ? ?We will plan to see him back when he has his next cycle.  We may have to think about adjusting the  prophylactic ciprofloxacin.  Hopefully, the dosage adjustment will be adequate enough for [place].  ? ? ?Volanda Napoleon, MD ?4/10/20231:32 PM  ?

## 2021-11-03 LAB — URINE CULTURE: Culture: NO GROWTH

## 2021-11-06 LAB — CULTURE, BLOOD (ROUTINE X 2)
Culture: NO GROWTH
Culture: NO GROWTH
Special Requests: ADEQUATE
Special Requests: ADEQUATE

## 2021-11-09 ENCOUNTER — Other Ambulatory Visit: Payer: Self-pay | Admitting: Family Medicine

## 2021-11-09 DIAGNOSIS — E669 Obesity, unspecified: Secondary | ICD-10-CM

## 2021-11-09 DIAGNOSIS — I10 Essential (primary) hypertension: Secondary | ICD-10-CM

## 2021-11-10 DIAGNOSIS — Z170 Estrogen receptor positive status [ER+]: Secondary | ICD-10-CM | POA: Diagnosis not present

## 2021-11-10 DIAGNOSIS — C50121 Malignant neoplasm of central portion of right male breast: Secondary | ICD-10-CM | POA: Diagnosis not present

## 2021-11-16 ENCOUNTER — Inpatient Hospital Stay (HOSPITAL_BASED_OUTPATIENT_CLINIC_OR_DEPARTMENT_OTHER): Payer: BC Managed Care – PPO | Admitting: Hematology & Oncology

## 2021-11-16 ENCOUNTER — Inpatient Hospital Stay: Payer: BC Managed Care – PPO

## 2021-11-16 ENCOUNTER — Encounter: Payer: Self-pay | Admitting: *Deleted

## 2021-11-16 ENCOUNTER — Encounter: Payer: Self-pay | Admitting: Hematology & Oncology

## 2021-11-16 VITALS — BP 132/74 | HR 92 | Temp 98.1°F | Resp 19

## 2021-11-16 VITALS — BP 130/76 | HR 98 | Temp 98.2°F | Resp 20 | Wt 330.0 lb

## 2021-11-16 DIAGNOSIS — C773 Secondary and unspecified malignant neoplasm of axilla and upper limb lymph nodes: Secondary | ICD-10-CM

## 2021-11-16 DIAGNOSIS — Z170 Estrogen receptor positive status [ER+]: Secondary | ICD-10-CM | POA: Diagnosis not present

## 2021-11-16 DIAGNOSIS — C50921 Malignant neoplasm of unspecified site of right male breast: Secondary | ICD-10-CM | POA: Diagnosis not present

## 2021-11-16 DIAGNOSIS — C50021 Malignant neoplasm of nipple and areola, right male breast: Secondary | ICD-10-CM | POA: Diagnosis not present

## 2021-11-16 DIAGNOSIS — Z5189 Encounter for other specified aftercare: Secondary | ICD-10-CM | POA: Diagnosis not present

## 2021-11-16 DIAGNOSIS — Z79899 Other long term (current) drug therapy: Secondary | ICD-10-CM | POA: Diagnosis not present

## 2021-11-16 DIAGNOSIS — D709 Neutropenia, unspecified: Secondary | ICD-10-CM | POA: Diagnosis not present

## 2021-11-16 DIAGNOSIS — Z5111 Encounter for antineoplastic chemotherapy: Secondary | ICD-10-CM | POA: Diagnosis not present

## 2021-11-16 LAB — CBC WITH DIFFERENTIAL (CANCER CENTER ONLY)
Abs Immature Granulocytes: 0.17 10*3/uL — ABNORMAL HIGH (ref 0.00–0.07)
Basophils Absolute: 0 10*3/uL (ref 0.0–0.1)
Basophils Relative: 0 %
Eosinophils Absolute: 0 10*3/uL (ref 0.0–0.5)
Eosinophils Relative: 0 %
HCT: 46.7 % (ref 39.0–52.0)
Hemoglobin: 15.4 g/dL (ref 13.0–17.0)
Immature Granulocytes: 1 %
Lymphocytes Relative: 12 %
Lymphs Abs: 1.9 10*3/uL (ref 0.7–4.0)
MCH: 26.3 pg (ref 26.0–34.0)
MCHC: 33 g/dL (ref 30.0–36.0)
MCV: 79.7 fL — ABNORMAL LOW (ref 80.0–100.0)
Monocytes Absolute: 1.5 10*3/uL — ABNORMAL HIGH (ref 0.1–1.0)
Monocytes Relative: 9 %
Neutro Abs: 12.5 10*3/uL — ABNORMAL HIGH (ref 1.7–7.7)
Neutrophils Relative %: 78 %
Platelet Count: 541 10*3/uL — ABNORMAL HIGH (ref 150–400)
RBC: 5.86 MIL/uL — ABNORMAL HIGH (ref 4.22–5.81)
RDW: 14.1 % (ref 11.5–15.5)
WBC Count: 16 10*3/uL — ABNORMAL HIGH (ref 4.0–10.5)
nRBC: 0 % (ref 0.0–0.2)

## 2021-11-16 LAB — CMP (CANCER CENTER ONLY)
ALT: 21 U/L (ref 0–44)
AST: 12 U/L — ABNORMAL LOW (ref 15–41)
Albumin: 4.2 g/dL (ref 3.5–5.0)
Alkaline Phosphatase: 36 U/L — ABNORMAL LOW (ref 38–126)
Anion gap: 10 (ref 5–15)
BUN: 19 mg/dL (ref 6–20)
CO2: 25 mmol/L (ref 22–32)
Calcium: 9.9 mg/dL (ref 8.9–10.3)
Chloride: 100 mmol/L (ref 98–111)
Creatinine: 0.93 mg/dL (ref 0.61–1.24)
GFR, Estimated: >60 mL/min (ref >60)
Glucose, Bld: 150 mg/dL — ABNORMAL HIGH (ref 70–99)
Potassium: 3.7 mmol/L (ref 3.5–5.1)
Sodium: 135 mmol/L (ref 135–145)
Total Bilirubin: 0.4 mg/dL (ref 0.3–1.2)
Total Protein: 7.2 g/dL (ref 6.5–8.1)

## 2021-11-16 MED ORDER — SODIUM CHLORIDE 0.9 % IV SOLN
10.0000 mg | Freq: Once | INTRAVENOUS | Status: AC
  Administered 2021-11-16: 10 mg via INTRAVENOUS
  Filled 2021-11-16: qty 10

## 2021-11-16 MED ORDER — HEPARIN SOD (PORK) LOCK FLUSH 100 UNIT/ML IV SOLN
500.0000 [IU] | Freq: Once | INTRAVENOUS | Status: AC | PRN
  Administered 2021-11-16: 500 [IU]

## 2021-11-16 MED ORDER — DOXORUBICIN HCL CHEMO IV INJECTION 2 MG/ML
45.0000 mg/m2 | Freq: Once | INTRAVENOUS | Status: AC
  Administered 2021-11-16: 122 mg via INTRAVENOUS
  Filled 2021-11-16: qty 61

## 2021-11-16 MED ORDER — SODIUM CHLORIDE 0.9 % IV SOLN
150.0000 mg | Freq: Once | INTRAVENOUS | Status: AC
  Administered 2021-11-16: 150 mg via INTRAVENOUS
  Filled 2021-11-16: qty 150

## 2021-11-16 MED ORDER — SODIUM CHLORIDE 0.9 % IV SOLN
500.0000 mg/m2 | Freq: Once | INTRAVENOUS | Status: AC
  Administered 2021-11-16: 1360 mg via INTRAVENOUS
  Filled 2021-11-16: qty 55

## 2021-11-16 MED ORDER — SODIUM CHLORIDE 0.9% FLUSH
10.0000 mL | INTRAVENOUS | Status: DC | PRN
  Administered 2021-11-16: 10 mL

## 2021-11-16 MED ORDER — CIPROFLOXACIN HCL 500 MG PO TABS: 500.0000 mg | ORAL_TABLET | Freq: Every day | ORAL | 3 refills | Status: DC

## 2021-11-16 MED ORDER — PALONOSETRON HCL INJECTION 0.25 MG/5ML
0.2500 mg | Freq: Once | INTRAVENOUS | Status: AC
  Administered 2021-11-16: 0.25 mg via INTRAVENOUS
  Filled 2021-11-16: qty 5

## 2021-11-16 MED ORDER — SODIUM CHLORIDE 0.9 % IV SOLN: Freq: Once | INTRAVENOUS | Status: AC

## 2021-11-16 MED ORDER — SODIUM CHLORIDE 0.9 % IV SOLN
60.0000 mg/m2 | Freq: Once | INTRAVENOUS | Status: AC
  Administered 2021-11-16: 160 mg via INTRAVENOUS
  Filled 2021-11-16: qty 16

## 2021-11-16 NOTE — Progress Notes (Signed)
Oncology Nurse Navigator Documentation ? ? ?  11/16/2021  ? 10:30 AM  ?Oncology Nurse Navigator Flowsheets  ?Navigator Follow Up Date: 12/09/2021  ?Navigator Follow Up Reason: Follow-up Appointment;Chemotherapy  ?Navigator Location [facility]  ?Navigator Encounter Type Appt/Treatment Plan Review  ?Patient Visit Type MedOnc  ?Treatment Phase Active Tx  ?Barriers/Navigation Needs No Barriers At This Time  ?Interventions None Required  ?Acuity Level 2-Minimal Needs (1-2 Barriers Identified)  ?Support Groups/Services Friends and Family  ?Time Spent with Patient 15  ?  ?

## 2021-11-16 NOTE — Progress Notes (Signed)
?Hematology and Oncology Follow Up Visit ? ?Noah Taylor ?[id number] ?04/12/1966 56 y.o. ?11/16/2021 ? ? 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 ?Current Therapy:   ?Neoadjuvant TAC --s/p cycle #1 - start on 10/26/2021 ?    ?Interim History:  Mr. Noah Taylor is back for follow-up.  He is doing quite well.  Blood family saw him, he was quite neutropenic.  He was on prophylactic ciprofloxacin.  He will continue this also after this cycle. ? ?The mass in the right breast area seems to be smaller. ? ?He has had no problems with diarrhea.  He has had no bleeding.  He has had no mouth sores.  He has had no rashes.  There is been no ecchymoses.  He has had no leg swelling. ? ?Overall, I would have to say that his performance status is probably ECOG 1.   ? ?Medications:  ?Current Outpatient Medications:  ?  dexamethasone (DECADRON) 4 MG tablet, Take 2 tablets (8 mg total) by mouth 2 (two) times daily. Start the day before Taxotere. Then take daily x 3 days after chemotherapy., Disp: 30 tablet, Rfl: 1 ?  famotidine (PEPCID) 20 MG tablet, Take by mouth 2 (two) times daily., Disp: , Rfl:  ?  FARXIGA 10 MG TABS tablet, TAKE 1 TABLET DAILY, Disp: 90 tablet, Rfl: 3 ?  fenofibrate (TRICOR) 48 MG tablet, TAKE 1 TABLET DAILY, Disp: 90 tablet, Rfl: 3 ?  hydrochlorothiazide (HYDRODIURIL) 25 MG tablet, TAKE 1 TABLET DAILY, Disp: 90 tablet, Rfl: 3 ?  loratadine (CLARITIN) 10 MG tablet, Take 1 tablet by mouth daily., Disp: , Rfl:  ?  losartan (COZAAR) 50 MG tablet, TAKE 1 TABLET DAILY, Disp: 90 tablet, Rfl: 3 ?  metFORMIN (GLUCOPHAGE) 1000 MG tablet, TAKE 1 TABLET TWICE A DAY WITH MEALS, Disp: 180 tablet, Rfl: 3 ?  pioglitazone (ACTOS) 30 MG tablet, Take 1 tablet (30 mg total) by mouth daily., Disp: 30 tablet, Rfl: 3 ?  pravastatin (PRAVACHOL) 20 MG tablet, TAKE 1 TABLET DAILY, Disp: 90 tablet, Rfl: 3 ?  dexamethasone (DECADRON) 4 MG tablet, Take 1 tablet (4 mg  total) by mouth 2 (two) times daily with a meal. Take 2 tablets twice a day starting 2 days before each cycle of chemotherapy.. (Patient not taking: Reported on 11/16/2021), Disp: 40 tablet, Rfl: 0 ?  ondansetron (ZOFRAN) 8 MG tablet, Take 1 tablet (8 mg total) by mouth 2 (two) times daily as needed (Nausea or vomiting). Start on the third day after chemotherapy. (Patient not taking: Reported on 11/16/2021), Disp: 30 tablet, Rfl: 1 ?  prochlorperazine (COMPAZINE) 10 MG tablet, Take 1 tablet (10 mg total) by mouth every 6 (six) hours as needed (Nausea or vomiting). (Patient not taking: Reported on 11/16/2021), Disp: 30 tablet, Rfl: 1 ? ?Allergies: No Known Allergies ? ?Past Medical History, Surgical history, Social history, and Family History were reviewed and updated. ? ?Review of Systems: ?Review of Systems  ?Constitutional: Negative.   ?HENT:  Negative.    ?Eyes: Negative.   ?Respiratory: Negative.    ?Cardiovascular: Negative.   ?Gastrointestinal: Negative.   ?Endocrine: Negative.   ?Genitourinary: Negative.    ?Musculoskeletal: Negative.   ?Skin: Negative.   ?Neurological: Negative.   ?Hematological: Negative.   ?Psychiatric/Behavioral: Negative.    ? ?Physical Exam: ? weight is 330 lb (149.7 kg) (abnormal). His oral temperature is 98.2 ?F (36.8 ?C). His blood pressure is 130/76 and his pulse is 98. His respiration is 20  and oxygen saturation is 98%.  ? ?Wt Readings from Last 3 Encounters:  ?11/16/21 (!) 330 lb (149.7 kg)  ?11/02/21 (!) 346 lb (156.9 kg)  ?11/01/21 (!) 338 lb (153.3 kg)  ? ? ?Physical Exam ?Vitals reviewed.  ?Constitutional:   ?   Comments: Left breast is unremarkable.  Right breast shows a retroareolar mass.  This measured 5 x 4 cm.  It is firm and not mobile.  I cannot palpate any obvious adenopathy in the right axilla.  ?HENT:  ?   Head: Normocephalic and atraumatic.  ?Eyes:  ?   Pupils: Pupils are equal, round, and reactive to light.  ?Cardiovascular:  ?   Rate and Rhythm: Normal rate and  regular rhythm.  ?   Heart sounds: Normal heart sounds.  ?Pulmonary:  ?   Effort: Pulmonary effort is normal.  ?   Breath sounds: Normal breath sounds.  ?Abdominal:  ?   General: Bowel sounds are normal.  ?   Palpations: Abdomen is soft.  ?Musculoskeletal:     ?   General: No tenderness or deformity. Normal range of motion.  ?   Cervical back: Normal range of motion.  ?Lymphadenopathy:  ?   Cervical: No cervical adenopathy.  ?Skin: ?   General: Skin is warm and dry.  ?   Findings: No erythema or rash.  ?Neurological:  ?   Mental Status: He is alert and oriented to person, place, and time.  ?Psychiatric:     ?   Behavior: Behavior normal.     ?   Thought Content: Thought content normal.     ?   Judgment: Judgment normal.  ? ? ? ?Lab Results  ?Component Value Date  ? WBC 16.0 (H) 11/16/2021  ? HGB 15.4 11/16/2021  ? HCT 46.7 11/16/2021  ? MCV 79.7 (L) 11/16/2021  ? PLT 541 (H) 11/16/2021  ? ?  Chemistry   ?   ?Component Value Date/Time  ? NA 135 11/16/2021 0934  ? K 3.7 11/16/2021 0934  ? CL 100 11/16/2021 0934  ? CO2 25 11/16/2021 0934  ? BUN 19 11/16/2021 0934  ? CREATININE 0.93 11/16/2021 0934  ?    ?Component Value Date/Time  ? CALCIUM 9.9 11/16/2021 0934  ? ALKPHOS 36 (L) 11/16/2021 0934  ? AST 12 (L) 11/16/2021 0934  ? ALT 21 11/16/2021 0934  ? BILITOT 0.4 11/16/2021 0934  ?  ? ? ?Impression and Plan: ?Mr. Noah Taylor is a very nice 56 year old white male.  He has had 1 cycle of neoadjuvant therapy so far.  We will still plan for a total of 4 cycles. ? ?Hopefully, he will not run into problems with treatment after this cycle. ? ?We will plan for follow-up in 3 weeks. ? ?I am just happy that we are seeing a clinical response in the right breast area.  .  ? ? ?Volanda Napoleon, MD ?4/24/202310:49 AM  ?

## 2021-11-16 NOTE — Patient Instructions (Signed)
[facility]  Discharge Instructions: ?Thank you for choosing [facility] to provide your oncology and hematology care.  ? ?If you have a lab appointment with the [facility], please go directly to the [facility] and check in at the registration area. ? ?Wear comfortable clothing and clothing appropriate for easy access to any Portacath or PICC line.  ? ?We strive to give you quality time with your provider. You may need to reschedule your appointment if you arrive late (15 or more minutes).  Arriving late affects you and other patients whose appointments are after yours.  Also, if you miss three or more appointments without notifying the office, you may be dismissed from the clinic at the provider?s discretion.    ?  ?For prescription refill requests, have your pharmacy contact our office and allow 72 hours for refills to be completed.   ? ?Today you received the following chemotherapy and/or immunotherapy agents Adriamycin, Taxotere, Cytoxan    ?  ?To help prevent nausea and vomiting after your treatment, we encourage you to take your nausea medication as directed. ? ?BELOW ARE SYMPTOMS THAT SHOULD BE REPORTED IMMEDIATELY: ?*FEVER GREATER THAN 100.4 F (38 ?C) OR HIGHER ?*CHILLS OR SWEATING ?*NAUSEA AND VOMITING THAT IS NOT CONTROLLED WITH YOUR NAUSEA MEDICATION ?*UNUSUAL SHORTNESS OF BREATH ?*UNUSUAL BRUISING OR BLEEDING ?*URINARY PROBLEMS (pain or burning when urinating, or frequent urination) ?*BOWEL PROBLEMS (unusual diarrhea, constipation, pain near the anus) ?TENDERNESS IN MOUTH AND THROAT WITH OR WITHOUT PRESENCE OF ULCERS (sore throat, sores in mouth, or a toothache) ?UNUSUAL RASH, SWELLING OR PAIN  ?UNUSUAL VAGINAL DISCHARGE OR ITCHING  ? ?Items with * indicate a potential emergency and should be followed up as soon as possible or go to the Emergency Department if any problems should occur. ? ?Please show the CHEMOTHERAPY ALERT CARD or IMMUNOTHERAPY ALERT CARD  at check-in to the Emergency Department and triage nurse. ?Should you have questions after your visit or need to cancel or reschedule your appointment, please contact [facility]  [phone] and follow the prompts.  Office hours are 8:00 a.m. to 4:30 p.m. Monday - Friday. Please note that voicemails left after 4:00 p.m. may not be returned until the following business day.  We are closed weekends and major holidays. You have access to a nurse at all times for urgent questions. Please call the main number to the clinic [phone] and follow the prompts. ? ?For any non-urgent questions, you may also contact your provider using MyChart. We now offer e-Visits for anyone 38 and older to request care online for non-urgent symptoms. For details visit [email or web address]. ?  ?Also download the MyChart app! Go to the app store, search "MyChart", open the app, select [facility], and log in with your MyChart username and password. ? ?Due to Covid, a mask is required upon entering the hospital/clinic. If you do not have a mask, one will be given to you upon arrival. For doctor visits, patients may have 1 support person aged 60 or older with them. For treatment visits, patients cannot have anyone with them due to current Covid guidelines and our immunocompromised population.  ?

## 2021-11-18 ENCOUNTER — Inpatient Hospital Stay: Payer: BC Managed Care – PPO

## 2021-11-18 VITALS — BP 126/69 | HR 78 | Temp 98.0°F | Resp 16

## 2021-11-18 DIAGNOSIS — C50021 Malignant neoplasm of nipple and areola, right male breast: Secondary | ICD-10-CM | POA: Diagnosis not present

## 2021-11-18 DIAGNOSIS — C773 Secondary and unspecified malignant neoplasm of axilla and upper limb lymph nodes: Secondary | ICD-10-CM | POA: Diagnosis not present

## 2021-11-18 DIAGNOSIS — Z5189 Encounter for other specified aftercare: Secondary | ICD-10-CM | POA: Diagnosis not present

## 2021-11-18 DIAGNOSIS — Z170 Estrogen receptor positive status [ER+]: Secondary | ICD-10-CM | POA: Diagnosis not present

## 2021-11-18 DIAGNOSIS — D709 Neutropenia, unspecified: Secondary | ICD-10-CM | POA: Diagnosis not present

## 2021-11-18 DIAGNOSIS — Z79899 Other long term (current) drug therapy: Secondary | ICD-10-CM | POA: Diagnosis not present

## 2021-11-18 DIAGNOSIS — Z5111 Encounter for antineoplastic chemotherapy: Secondary | ICD-10-CM | POA: Diagnosis not present

## 2021-11-18 MED ORDER — PEGFILGRASTIM-CBQV 6 MG/0.6ML ~~LOC~~ SOSY
6.0000 mg | PREFILLED_SYRINGE | Freq: Once | SUBCUTANEOUS | Status: AC
  Administered 2021-11-18: 6 mg via SUBCUTANEOUS
  Filled 2021-11-18: qty 0.6

## 2021-11-18 NOTE — Patient Instructions (Signed)

## 2021-11-20 ENCOUNTER — Encounter: Payer: Self-pay | Admitting: Hematology & Oncology

## 2021-11-20 ENCOUNTER — Encounter: Payer: Self-pay | Admitting: Family Medicine

## 2021-12-09 ENCOUNTER — Inpatient Hospital Stay: Payer: BC Managed Care – PPO

## 2021-12-09 ENCOUNTER — Encounter: Payer: Self-pay | Admitting: *Deleted

## 2021-12-09 ENCOUNTER — Inpatient Hospital Stay (HOSPITAL_BASED_OUTPATIENT_CLINIC_OR_DEPARTMENT_OTHER): Payer: BC Managed Care – PPO | Admitting: Hematology & Oncology

## 2021-12-09 ENCOUNTER — Encounter: Payer: Self-pay | Admitting: Hematology & Oncology

## 2021-12-09 ENCOUNTER — Other Ambulatory Visit: Payer: Self-pay | Admitting: *Deleted

## 2021-12-09 ENCOUNTER — Other Ambulatory Visit: Payer: Self-pay

## 2021-12-09 ENCOUNTER — Inpatient Hospital Stay: Payer: BC Managed Care – PPO | Attending: Hematology & Oncology

## 2021-12-09 VITALS — BP 143/81 | HR 103 | Temp 98.8°F | Resp 18 | Ht 68.0 in | Wt 324.1 lb

## 2021-12-09 DIAGNOSIS — Z170 Estrogen receptor positive status [ER+]: Secondary | ICD-10-CM | POA: Insufficient documentation

## 2021-12-09 DIAGNOSIS — C50021 Malignant neoplasm of nipple and areola, right male breast: Secondary | ICD-10-CM | POA: Diagnosis not present

## 2021-12-09 DIAGNOSIS — Z5111 Encounter for antineoplastic chemotherapy: Secondary | ICD-10-CM | POA: Insufficient documentation

## 2021-12-09 DIAGNOSIS — Z5189 Encounter for other specified aftercare: Secondary | ICD-10-CM | POA: Diagnosis not present

## 2021-12-09 DIAGNOSIS — C50921 Malignant neoplasm of unspecified site of right male breast: Secondary | ICD-10-CM

## 2021-12-09 DIAGNOSIS — C773 Secondary and unspecified malignant neoplasm of axilla and upper limb lymph nodes: Secondary | ICD-10-CM

## 2021-12-09 LAB — CMP (CANCER CENTER ONLY)
ALT: 21 U/L (ref 0–44)
AST: 13 U/L — ABNORMAL LOW (ref 15–41)
Albumin: 4.3 g/dL (ref 3.5–5.0)
Alkaline Phosphatase: 36 U/L — ABNORMAL LOW (ref 38–126)
Anion gap: 12 (ref 5–15)
BUN: 20 mg/dL (ref 6–20)
CO2: 23 mmol/L (ref 22–32)
Calcium: 9.7 mg/dL (ref 8.9–10.3)
Chloride: 101 mmol/L (ref 98–111)
Creatinine: 0.95 mg/dL (ref 0.61–1.24)
GFR, Estimated: >60 mL/min (ref >60)
Glucose, Bld: 201 mg/dL — ABNORMAL HIGH (ref 70–99)
Potassium: 3.7 mmol/L (ref 3.5–5.1)
Sodium: 136 mmol/L (ref 135–145)
Total Bilirubin: 0.6 mg/dL (ref 0.3–1.2)
Total Protein: 7.2 g/dL (ref 6.5–8.1)

## 2021-12-09 LAB — CBC WITH DIFFERENTIAL (CANCER CENTER ONLY)
Abs Immature Granulocytes: 0.14 10*3/uL — ABNORMAL HIGH (ref 0.00–0.07)
Basophils Absolute: 0 10*3/uL (ref 0.0–0.1)
Basophils Relative: 0 %
Eosinophils Absolute: 0 10*3/uL (ref 0.0–0.5)
Eosinophils Relative: 0 %
HCT: 44.4 % (ref 39.0–52.0)
Hemoglobin: 14.8 g/dL (ref 13.0–17.0)
Immature Granulocytes: 1 %
Lymphocytes Relative: 10 %
Lymphs Abs: 1.6 10*3/uL (ref 0.7–4.0)
MCH: 26.9 pg (ref 26.0–34.0)
MCHC: 33.3 g/dL (ref 30.0–36.0)
MCV: 80.6 fL (ref 80.0–100.0)
Monocytes Absolute: 1.2 10*3/uL — ABNORMAL HIGH (ref 0.1–1.0)
Monocytes Relative: 8 %
Neutro Abs: 12.9 10*3/uL — ABNORMAL HIGH (ref 1.7–7.7)
Neutrophils Relative %: 81 %
Platelet Count: 466 10*3/uL — ABNORMAL HIGH (ref 150–400)
RBC: 5.51 MIL/uL (ref 4.22–5.81)
RDW: 15.3 % (ref 11.5–15.5)
WBC Count: 15.9 10*3/uL — ABNORMAL HIGH (ref 4.0–10.5)
nRBC: 0 % (ref 0.0–0.2)

## 2021-12-09 LAB — LACTATE DEHYDROGENASE: LDH: 183 U/L (ref 98–192)

## 2021-12-09 MED ORDER — SODIUM CHLORIDE 0.9 % IV SOLN
500.0000 mg/m2 | Freq: Once | INTRAVENOUS | Status: AC
  Administered 2021-12-09: 1360 mg via INTRAVENOUS
  Filled 2021-12-09: qty 20

## 2021-12-09 MED ORDER — SODIUM CHLORIDE 0.9 % IV SOLN
150.0000 mg | Freq: Once | INTRAVENOUS | Status: AC
  Administered 2021-12-09: 150 mg via INTRAVENOUS
  Filled 2021-12-09: qty 5

## 2021-12-09 MED ORDER — SODIUM CHLORIDE 0.9 % IV SOLN
60.0000 mg/m2 | Freq: Once | INTRAVENOUS | Status: AC
  Administered 2021-12-09: 160 mg via INTRAVENOUS
  Filled 2021-12-09: qty 16

## 2021-12-09 MED ORDER — SODIUM CHLORIDE 0.9% FLUSH
10.0000 mL | INTRAVENOUS | Status: DC | PRN
  Administered 2021-12-09: 10 mL

## 2021-12-09 MED ORDER — CIPROFLOXACIN HCL 500 MG PO TABS: 500.0000 mg | ORAL_TABLET | Freq: Every day | ORAL | 1 refills | Status: DC

## 2021-12-09 MED ORDER — DEXAMETHASONE 4 MG PO TABS: 8.0000 mg | ORAL_TABLET | Freq: Two times a day (BID) | ORAL | 1 refills | Status: DC

## 2021-12-09 MED ORDER — DOXORUBICIN HCL CHEMO IV INJECTION 2 MG/ML
45.0000 mg/m2 | Freq: Once | INTRAVENOUS | Status: AC
  Administered 2021-12-09: 122 mg via INTRAVENOUS
  Filled 2021-12-09: qty 61

## 2021-12-09 MED ORDER — SODIUM CHLORIDE 0.9 % IV SOLN: Freq: Once | INTRAVENOUS | Status: DC

## 2021-12-09 MED ORDER — PALONOSETRON HCL INJECTION 0.25 MG/5ML
0.2500 mg | Freq: Once | INTRAVENOUS | Status: AC
  Administered 2021-12-09: 0.25 mg via INTRAVENOUS
  Filled 2021-12-09: qty 5

## 2021-12-09 MED ORDER — SODIUM CHLORIDE 0.9 % IV SOLN
10.0000 mg | Freq: Once | INTRAVENOUS | Status: AC
  Administered 2021-12-09: 10 mg via INTRAVENOUS
  Filled 2021-12-09: qty 10

## 2021-12-09 MED ORDER — HEPARIN SOD (PORK) LOCK FLUSH 100 UNIT/ML IV SOLN
500.0000 [IU] | Freq: Once | INTRAVENOUS | Status: AC | PRN
  Administered 2021-12-09: 500 [IU]

## 2021-12-09 NOTE — Progress Notes (Signed)
Patient is tolerating treatment well with no significant side effects. Will receive cycle three of four today.  ? ?Oncology Nurse Navigator Documentation ? ? ?  12/09/2021  ?  9:00 AM  ?Oncology Nurse Navigator Flowsheets  ?Navigator Follow Up Date: 12/30/2021  ?Navigator Follow Up Reason: Follow-up Appointment;Chemotherapy  ?Navigator Location [facility]  ?Navigator Encounter Type Appt/Treatment Plan Review  ?Patient Visit Type MedOnc  ?Treatment Phase Active Tx  ?Barriers/Navigation Needs No Barriers At This Time  ?Interventions None Required  ?Acuity Level 1-No Barriers  ?Support Groups/Services Friends and Family  ?Time Spent with Patient 15  ?  ?

## 2021-12-09 NOTE — Progress Notes (Signed)
?Hematology and Oncology Follow Up Visit ? ?Noah Taylor ?[id number] ?01/20/1966 56 y.o. ?12/09/2021 ? ? 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 ?Current Therapy:   ?Neoadjuvant TAC --s/p cycle #2/4 - start on 10/26/2021 ?    ?Interim History:  Noah Taylor is back for follow-up.  The second cycle of treatment went very well.  He is already noticed a very nice decrease in the right breast mass.  I am very happy for him. ? ?He has had no problems with fever.  He has had no issues with nausea or vomiting.  There is been no mouth sores.  He has had no rashes.  There has been no bleeding.  He has had no headache. ? ?We have him on prophylactic ciprofloxacin.  I think this is helping. ? ?He is still working.  He is working from home quite a bit. ? ?Overall, I would have to say that his performance status for now is ECOG 1.   ? ? ?Medications:  ?Current Outpatient Medications:  ?  ciprofloxacin (CIPRO) 500 MG tablet, Take 1 tablet (500 mg total) by mouth daily with breakfast., Disp: 15 tablet, Rfl: 3 ?  dexamethasone (DECADRON) 4 MG tablet, Take 2 tablets (8 mg total) by mouth 2 (two) times daily. Start the day before Taxotere. Then take daily x 3 days after chemotherapy., Disp: 30 tablet, Rfl: 1 ?  famotidine (PEPCID) 20 MG tablet, Take by mouth 2 (two) times daily., Disp: , Rfl:  ?  FARXIGA 10 MG TABS tablet, TAKE 1 TABLET DAILY, Disp: 90 tablet, Rfl: 3 ?  fenofibrate (TRICOR) 48 MG tablet, TAKE 1 TABLET DAILY, Disp: 90 tablet, Rfl: 3 ?  hydrochlorothiazide (HYDRODIURIL) 25 MG tablet, TAKE 1 TABLET DAILY, Disp: 90 tablet, Rfl: 3 ?  loratadine (CLARITIN) 10 MG tablet, Take 1 tablet by mouth daily., Disp: , Rfl:  ?  losartan (COZAAR) 50 MG tablet, TAKE 1 TABLET DAILY, Disp: 90 tablet, Rfl: 3 ?  metFORMIN (GLUCOPHAGE) 1000 MG tablet, TAKE 1 TABLET TWICE A DAY WITH MEALS, Disp: 180 tablet, Rfl: 3 ?  ondansetron (ZOFRAN) 8 MG tablet, Take 1  tablet (8 mg total) by mouth 2 (two) times daily as needed (Nausea or vomiting). Start on the third day after chemotherapy., Disp: 30 tablet, Rfl: 1 ?  pioglitazone (ACTOS) 30 MG tablet, Take 1 tablet (30 mg total) by mouth daily., Disp: 30 tablet, Rfl: 3 ?  pravastatin (PRAVACHOL) 20 MG tablet, TAKE 1 TABLET DAILY, Disp: 90 tablet, Rfl: 3 ?  prochlorperazine (COMPAZINE) 10 MG tablet, Take 1 tablet (10 mg total) by mouth every 6 (six) hours as needed (Nausea or vomiting)., Disp: 30 tablet, Rfl: 1 ? ?Allergies: No Known Allergies ? ?Past Medical History, Surgical history, Social history, and Family History were reviewed and updated. ? ?Review of Systems: ?Review of Systems  ?Constitutional: Negative.   ?HENT:  Negative.    ?Eyes: Negative.   ?Respiratory: Negative.    ?Cardiovascular: Negative.   ?Gastrointestinal: Negative.   ?Endocrine: Negative.   ?Genitourinary: Negative.    ?Musculoskeletal: Negative.   ?Skin: Negative.   ?Neurological: Negative.   ?Hematological: Negative.   ?Psychiatric/Behavioral: Negative.    ? ?Physical Exam: ? height is 5' 8" (1.727 m) and weight is 324 lb 1.9 oz (147 kg) (abnormal). His oral temperature is 98.8 ?F (37.1 ?C). His blood pressure is 143/81 (abnormal) and his pulse is 103 (abnormal). His respiration is 18 and oxygen saturation is 97%.  ? ?  Wt Readings from Last 3 Encounters:  ?12/09/21 (!) 324 lb 1.9 oz (147 kg)  ?11/16/21 (!) 330 lb (149.7 kg)  ?11/02/21 (!) 346 lb (156.9 kg)  ? ? ?Physical Exam ?Vitals reviewed.  ?Constitutional:   ?   Comments: Left breast is unremarkable.  Right breast shows a retroareolar mass.  This appears to be shrinking nicely.  It is not as nearly as firm.  It is about 2 x 3 cm now.  I cannot palpate any obvious adenopathy in the right axilla.  ?HENT:  ?   Head: Normocephalic and atraumatic.  ?Eyes:  ?   Pupils: Pupils are equal, round, and reactive to light.  ?Cardiovascular:  ?   Rate and Rhythm: Normal rate and regular rhythm.  ?   Heart sounds:  Normal heart sounds.  ?Pulmonary:  ?   Effort: Pulmonary effort is normal.  ?   Breath sounds: Normal breath sounds.  ?Abdominal:  ?   General: Bowel sounds are normal.  ?   Palpations: Abdomen is soft.  ?Musculoskeletal:     ?   General: No tenderness or deformity. Normal range of motion.  ?   Cervical back: Normal range of motion.  ?Lymphadenopathy:  ?   Cervical: No cervical adenopathy.  ?Skin: ?   General: Skin is warm and dry.  ?   Findings: No erythema or rash.  ?Neurological:  ?   Mental Status: He is alert and oriented to person, place, and time.  ?Psychiatric:     ?   Behavior: Behavior normal.     ?   Thought Content: Thought content normal.     ?   Judgment: Judgment normal.  ? ? ? ?Lab Results  ?Component Value Date  ? WBC 15.9 (H) 12/09/2021  ? HGB 14.8 12/09/2021  ? HCT 44.4 12/09/2021  ? MCV 80.6 12/09/2021  ? PLT 466 (H) 12/09/2021  ? ?  Chemistry   ?   ?Component Value Date/Time  ? NA 135 11/16/2021 0934  ? K 3.7 11/16/2021 0934  ? CL 100 11/16/2021 0934  ? CO2 25 11/16/2021 0934  ? BUN 19 11/16/2021 0934  ? CREATININE 0.93 11/16/2021 0934  ?    ?Component Value Date/Time  ? CALCIUM 9.9 11/16/2021 0934  ? ALKPHOS 36 (L) 11/16/2021 0934  ? AST 12 (L) 11/16/2021 0934  ? ALT 21 11/16/2021 0934  ? BILITOT 0.4 11/16/2021 0934  ?  ? ? ?Impression and Plan: ? ?Noah Taylor is a very nice 56-year-old white male.  He has had 2 cycles of neoadjuvant therapy so far.  He is responding well.  I feel very confident that he will have very limited disease when he comes time for his mastectomy. ? ?We will still plan for a total of 4 cycles. ? ?We will plan to get him back in another 3 weeks. ? ? ?Peter R Ennever, MD ?5/17/20238:04 AM  ?

## 2021-12-09 NOTE — Patient Instructions (Signed)

## 2021-12-11 ENCOUNTER — Inpatient Hospital Stay: Payer: BC Managed Care – PPO

## 2021-12-11 VITALS — BP 130/75 | HR 72 | Temp 98.1°F | Resp 19

## 2021-12-11 DIAGNOSIS — Z5189 Encounter for other specified aftercare: Secondary | ICD-10-CM | POA: Diagnosis not present

## 2021-12-11 DIAGNOSIS — C50921 Malignant neoplasm of unspecified site of right male breast: Secondary | ICD-10-CM

## 2021-12-11 DIAGNOSIS — C50021 Malignant neoplasm of nipple and areola, right male breast: Secondary | ICD-10-CM | POA: Diagnosis not present

## 2021-12-11 DIAGNOSIS — Z5111 Encounter for antineoplastic chemotherapy: Secondary | ICD-10-CM | POA: Diagnosis not present

## 2021-12-11 DIAGNOSIS — Z170 Estrogen receptor positive status [ER+]: Secondary | ICD-10-CM | POA: Diagnosis not present

## 2021-12-11 MED ORDER — PEGFILGRASTIM-CBQV 6 MG/0.6ML ~~LOC~~ SOSY
6.0000 mg | PREFILLED_SYRINGE | Freq: Once | SUBCUTANEOUS | Status: AC
  Administered 2021-12-11: 6 mg via SUBCUTANEOUS
  Filled 2021-12-11: qty 0.6

## 2021-12-11 NOTE — Patient Instructions (Signed)

## 2021-12-23 DIAGNOSIS — Z6841 Body Mass Index (BMI) 40.0 and over, adult: Secondary | ICD-10-CM | POA: Diagnosis not present

## 2021-12-23 DIAGNOSIS — C50121 Malignant neoplasm of central portion of right male breast: Secondary | ICD-10-CM | POA: Diagnosis not present

## 2021-12-23 DIAGNOSIS — Z170 Estrogen receptor positive status [ER+]: Secondary | ICD-10-CM | POA: Diagnosis not present

## 2021-12-23 DIAGNOSIS — Z9221 Personal history of antineoplastic chemotherapy: Secondary | ICD-10-CM | POA: Diagnosis not present

## 2021-12-30 ENCOUNTER — Other Ambulatory Visit: Payer: BC Managed Care – PPO

## 2021-12-30 ENCOUNTER — Inpatient Hospital Stay: Payer: BC Managed Care – PPO | Attending: Hematology & Oncology | Admitting: Family

## 2021-12-30 ENCOUNTER — Inpatient Hospital Stay: Payer: BC Managed Care – PPO

## 2021-12-30 ENCOUNTER — Encounter: Payer: Self-pay | Admitting: Family

## 2021-12-30 ENCOUNTER — Encounter: Payer: Self-pay | Admitting: Hematology & Oncology

## 2021-12-30 ENCOUNTER — Encounter: Payer: Self-pay | Admitting: *Deleted

## 2021-12-30 VITALS — BP 116/82 | HR 102 | Temp 98.7°F | Resp 18 | Wt 315.0 lb

## 2021-12-30 DIAGNOSIS — C50921 Malignant neoplasm of unspecified site of right male breast: Secondary | ICD-10-CM

## 2021-12-30 DIAGNOSIS — Z5111 Encounter for antineoplastic chemotherapy: Secondary | ICD-10-CM | POA: Diagnosis not present

## 2021-12-30 DIAGNOSIS — Z5189 Encounter for other specified aftercare: Secondary | ICD-10-CM | POA: Insufficient documentation

## 2021-12-30 DIAGNOSIS — C50021 Malignant neoplasm of nipple and areola, right male breast: Secondary | ICD-10-CM | POA: Insufficient documentation

## 2021-12-30 DIAGNOSIS — Z170 Estrogen receptor positive status [ER+]: Secondary | ICD-10-CM | POA: Insufficient documentation

## 2021-12-30 DIAGNOSIS — C773 Secondary and unspecified malignant neoplasm of axilla and upper limb lymph nodes: Secondary | ICD-10-CM

## 2021-12-30 LAB — CBC WITH DIFFERENTIAL (CANCER CENTER ONLY)
Abs Immature Granulocytes: 0.18 10*3/uL — ABNORMAL HIGH (ref 0.00–0.07)
Basophils Absolute: 0 10*3/uL (ref 0.0–0.1)
Basophils Relative: 0 %
Eosinophils Absolute: 0 10*3/uL (ref 0.0–0.5)
Eosinophils Relative: 0 %
HCT: 42.5 % (ref 39.0–52.0)
Hemoglobin: 14 g/dL (ref 13.0–17.0)
Immature Granulocytes: 1 %
Lymphocytes Relative: 10 %
Lymphs Abs: 1.8 10*3/uL (ref 0.7–4.0)
MCH: 26.6 pg (ref 26.0–34.0)
MCHC: 32.9 g/dL (ref 30.0–36.0)
MCV: 80.8 fL (ref 80.0–100.0)
Monocytes Absolute: 1.7 10*3/uL — ABNORMAL HIGH (ref 0.1–1.0)
Monocytes Relative: 9 %
Neutro Abs: 14 10*3/uL — ABNORMAL HIGH (ref 1.7–7.7)
Neutrophils Relative %: 80 %
Platelet Count: 496 10*3/uL — ABNORMAL HIGH (ref 150–400)
RBC: 5.26 MIL/uL (ref 4.22–5.81)
RDW: 16.6 % — ABNORMAL HIGH (ref 11.5–15.5)
WBC Count: 17.7 10*3/uL — ABNORMAL HIGH (ref 4.0–10.5)
nRBC: 0 % (ref 0.0–0.2)

## 2021-12-30 LAB — CMP (CANCER CENTER ONLY)
ALT: 20 U/L (ref 0–44)
AST: 15 U/L (ref 15–41)
Albumin: 4.4 g/dL (ref 3.5–5.0)
Alkaline Phosphatase: 37 U/L — ABNORMAL LOW (ref 38–126)
Anion gap: 13 (ref 5–15)
BUN: 24 mg/dL — ABNORMAL HIGH (ref 6–20)
CO2: 24 mmol/L (ref 22–32)
Calcium: 9.9 mg/dL (ref 8.9–10.3)
Chloride: 100 mmol/L (ref 98–111)
Creatinine: 1.02 mg/dL (ref 0.61–1.24)
GFR, Estimated: >60 mL/min (ref >60)
Glucose, Bld: 123 mg/dL — ABNORMAL HIGH (ref 70–99)
Potassium: 3.6 mmol/L (ref 3.5–5.1)
Sodium: 137 mmol/L (ref 135–145)
Total Bilirubin: 0.5 mg/dL (ref 0.3–1.2)
Total Protein: 7.1 g/dL (ref 6.5–8.1)

## 2021-12-30 LAB — LACTATE DEHYDROGENASE: LDH: 205 U/L — ABNORMAL HIGH (ref 98–192)

## 2021-12-30 MED ORDER — SODIUM CHLORIDE 0.9 % IV SOLN: Freq: Once | INTRAVENOUS | Status: AC

## 2021-12-30 MED ORDER — DOXORUBICIN HCL CHEMO IV INJECTION 2 MG/ML
45.0000 mg/m2 | Freq: Once | INTRAVENOUS | Status: AC
  Administered 2021-12-30: 122 mg via INTRAVENOUS
  Filled 2021-12-30: qty 61

## 2021-12-30 MED ORDER — SODIUM CHLORIDE 0.9% FLUSH
10.0000 mL | INTRAVENOUS | Status: DC | PRN
  Administered 2021-12-30: 10 mL

## 2021-12-30 MED ORDER — PALONOSETRON HCL INJECTION 0.25 MG/5ML
0.2500 mg | Freq: Once | INTRAVENOUS | Status: AC
  Administered 2021-12-30: 0.25 mg via INTRAVENOUS
  Filled 2021-12-30: qty 5

## 2021-12-30 MED ORDER — HEPARIN SOD (PORK) LOCK FLUSH 100 UNIT/ML IV SOLN
500.0000 [IU] | Freq: Once | INTRAVENOUS | Status: AC | PRN
  Administered 2021-12-30: 500 [IU]

## 2021-12-30 MED ORDER — SODIUM CHLORIDE 0.9 % IV SOLN
60.0000 mg/m2 | Freq: Once | INTRAVENOUS | Status: AC
  Administered 2021-12-30: 160 mg via INTRAVENOUS
  Filled 2021-12-30: qty 16

## 2021-12-30 MED ORDER — SODIUM CHLORIDE 0.9 % IV SOLN
10.0000 mg | Freq: Once | INTRAVENOUS | Status: AC
  Administered 2021-12-30: 10 mg via INTRAVENOUS
  Filled 2021-12-30: qty 10

## 2021-12-30 MED ORDER — SODIUM CHLORIDE 0.9 % IV SOLN
150.0000 mg | Freq: Once | INTRAVENOUS | Status: AC
  Administered 2021-12-30: 150 mg via INTRAVENOUS
  Filled 2021-12-30: qty 150

## 2021-12-30 MED ORDER — SODIUM CHLORIDE 0.9 % IV SOLN
500.0000 mg/m2 | Freq: Once | INTRAVENOUS | Status: AC
  Administered 2021-12-30: 1360 mg via INTRAVENOUS
  Filled 2021-12-30: qty 52

## 2021-12-30 NOTE — Progress Notes (Signed)
Hematology and Oncology Follow Up Visit  Noah Taylor [id number] March 05, 1966 56 y.o. 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 Staffieri is here today for follow-up and treatment. He is doing well and has tolerated treatment nicely so far. Today is cycle 4.  He is scheduled for right total mastectomy on 02/02/2022.  Right breast exam showed retro areolar mass continues to decrease in size. His right nipple is slightly inverted. No redness, edema or discharge noted. Left breast exam was negative.  No adenopathy or lymphedema noted on exam.  No fever, chills, n/v, cough, rash, dizziness, SOB, chest pain, palpitations, abdominal pain or changes in bowel or bladder habits.  No swelling, tenderness, numbness or tingling in his extremities at this time.  No falls or syncope to report.  Appetite comes and goes. He still does not have much taste. Hydration has been good. Weight is 315 lbs.   ECOG Performance Status: 1 - Symptomatic but completely ambulatory  Medications:  Allergies as of 12/30/2021   No Known Allergies      Medication List        Accurate as of December 30, 2021 10:07 AM. If you have any questions, ask your nurse or doctor.          ciprofloxacin 500 MG tablet Commonly known as: Cipro Take 1 tablet (500 mg total) by mouth daily with breakfast.   dexamethasone 4 MG tablet Commonly known as: DECADRON Take 2 tablets (8 mg total) by mouth 2 (two) times daily. Start the day before Taxotere. Then take daily x 3 days after chemotherapy.   famotidine 20 MG tablet Commonly known as: PEPCID Take by mouth 2 (two) times daily.   Farxiga 10 MG Tabs tablet Generic drug: dapagliflozin propanediol TAKE 1 TABLET DAILY   fenofibrate 48 MG tablet Commonly known as: TRICOR TAKE 1 TABLET  DAILY   hydrochlorothiazide 25 MG tablet Commonly known as: HYDRODIURIL TAKE 1 TABLET DAILY   loratadine 10 MG tablet Commonly known as: CLARITIN Take 1 tablet by mouth daily.   losartan 50 MG tablet Commonly known as: COZAAR TAKE 1 TABLET DAILY   metFORMIN 1000 MG tablet Commonly known as: GLUCOPHAGE TAKE 1 TABLET TWICE A DAY WITH MEALS   ondansetron 8 MG tablet Commonly known as: Zofran Take 1 tablet (8 mg total) by mouth 2 (two) times daily as needed (Nausea or vomiting). Start on the third day after chemotherapy.   pioglitazone 30 MG tablet Commonly known as: Actos Take 1 tablet (30 mg total) by mouth daily.   pravastatin 20 MG tablet Commonly known as: PRAVACHOL TAKE 1 TABLET DAILY   prochlorperazine 10 MG tablet Commonly known as: COMPAZINE Take 1 tablet (10 mg total) by mouth every 6 (six) hours as needed (Nausea or vomiting).        Allergies: No Known Allergies  Past Medical History, Surgical history, Social history, and Family History were reviewed and updated.  Review of Systems: All other 10 point review of systems is negative.   Physical Exam:  vitals were not taken for this visit.   Wt Readings from Last 3 Encounters:  12/09/21 (!) 324 lb 1.9 oz (147 kg)  11/16/21 (!) 330 lb (149.7 kg)  11/02/21 (!) 346 lb (156.9 kg)    Ocular: Sclerae unicteric, pupils equal, round  and reactive to light Ear-nose-throat: Oropharynx clear, dentition fair Lymphatic: No cervical or supraclavicular adenopathy Lungs no rales or rhonchi, good excursion bilaterally Heart regular rate and rhythm, no murmur appreciated Abd soft, nontender, positive bowel sounds MSK no focal spinal tenderness, no joint edema Neuro: non-focal, well-oriented, appropriate affect Breasts: Deferred   Lab Results  Component Value Date   WBC 17.7 (H) 12/30/2021   HGB 14.0 12/30/2021   HCT 42.5 12/30/2021   MCV 80.8 12/30/2021   PLT 496 (H) 12/30/2021   No results found for:  FERRITIN, IRON, TIBC, UIBC, IRONPCTSAT Lab Results  Component Value Date   RBC 5.26 12/30/2021   No results found for: KPAFRELGTCHN, LAMBDASER, KAPLAMBRATIO No results found for: IGGSERUM, IGA, IGMSERUM No results found for: Odetta Pink, SPEI   Chemistry      Component Value Date/Time   NA 136 12/09/2021 0755   K 3.7 12/09/2021 0755   CL 101 12/09/2021 0755   CO2 23 12/09/2021 0755   BUN 20 12/09/2021 0755   CREATININE 0.95 12/09/2021 0755      Component Value Date/Time   CALCIUM 9.7 12/09/2021 0755   ALKPHOS 36 (L) 12/09/2021 0755   AST 13 (L) 12/09/2021 0755   ALT 21 12/09/2021 0755   BILITOT 0.6 12/09/2021 0755       Impression and Plan: Mr. Shirer is a very pleasant 56 yo caucasian gentleman with locally advanced infiltrating ductal carcinoma of the right breast - retro areolar, ER+/PR+/HER2-.  We will proceed with cycle 4 today as planned.  He is scheduled for right total mastectomy on 7/11. MD follow-up in 2 months.   Lottie Dawson, NP 6/7/202310:07 AM

## 2021-12-30 NOTE — Patient Instructions (Signed)
[facility]  Discharge Instructions: Thank you for choosing [facility] to provide your oncology and hematology care.   If you have a lab appointment with the [facility], please go directly to the [facility] and check in at the registration area.  Wear comfortable clothing and clothing appropriate for easy access to any Portacath or PICC line.   We strive to give you quality time with your provider. You may need to reschedule your appointment if you arrive late (15 or more minutes).  Arriving late affects you and other patients whose appointments are after yours.  Also, if you miss three or more appointments without notifying the office, you may be dismissed from the clinic at the provider's discretion.      For prescription refill requests, have your pharmacy contact our office and allow 72 hours for refills to be completed.    Today you received the following chemotherapy and/or immunotherapy agents adria, cytoxan, taxotere    To help prevent nausea and vomiting after your treatment, we encourage you to take your nausea medication as directed.  BELOW ARE SYMPTOMS THAT SHOULD BE REPORTED IMMEDIATELY: *FEVER GREATER THAN 100.4 F (38 C) OR HIGHER *CHILLS OR SWEATING *NAUSEA AND VOMITING THAT IS NOT CONTROLLED WITH YOUR NAUSEA MEDICATION *UNUSUAL SHORTNESS OF BREATH *UNUSUAL BRUISING OR BLEEDING *URINARY PROBLEMS (pain or burning when urinating, or frequent urination) *BOWEL PROBLEMS (unusual diarrhea, constipation, pain near the anus) TENDERNESS IN MOUTH AND THROAT WITH OR WITHOUT PRESENCE OF ULCERS (sore throat, sores in mouth, or a toothache) UNUSUAL RASH, SWELLING OR PAIN  UNUSUAL VAGINAL DISCHARGE OR ITCHING   Items with * indicate a potential emergency and should be followed up as soon as possible or go to the Emergency Department if any problems should occur.  Please show the CHEMOTHERAPY ALERT CARD or IMMUNOTHERAPY ALERT CARD at  check-in to the Emergency Department and triage nurse. Should you have questions after your visit or need to cancel or reschedule your appointment, please contact [facility]  [phone] and follow the prompts.  Office hours are 8:00 a.m. to 4:30 p.m. Monday - Friday. Please note that voicemails left after 4:00 p.m. may not be returned until the following business day.  We are closed weekends and major holidays. You have access to a nurse at all times for urgent questions. Please call the main number to the clinic [phone] and follow the prompts.  For any non-urgent questions, you may also contact your provider using MyChart. We now offer e-Visits for anyone 51 and older to request care online for non-urgent symptoms. For details visit [email or web address].   Also download the MyChart app! Go to the app store, search "MyChart", open the app, select [facility], and log in with your MyChart username and password.  Due to Covid, a mask is required upon entering the hospital/clinic. If you do not have a mask, one will be given to you upon arrival. For doctor visits, patients may have 1 support person aged 18 or older with them. For treatment visits, patients cannot have anyone with them due to current Covid guidelines and our immunocompromised population.

## 2021-12-30 NOTE — Progress Notes (Signed)
Patient will receive cycle four of a planned four cycles prior to his surgery. This is already scheduled for 02/02/22.  Oncology Nurse Navigator Documentation     12/30/2021   10:45 AM  Oncology Nurse Navigator Flowsheets  Navigator Follow Up Date: 02/02/2022  Navigator Follow Up Reason: Surgery  Navigator Location [facility]  Navigator Encounter Type Treatment;Appt/Treatment Plan Review  Patient Visit Type MedOnc  Treatment Phase Final Chemo TX  Barriers/Navigation Needs No Barriers At This Time  Interventions Psycho-Social Support  Acuity Level 1-No Barriers  Support Groups/Services Friends and Family  Time Spent with Patient 15

## 2022-01-01 ENCOUNTER — Inpatient Hospital Stay: Payer: BC Managed Care – PPO

## 2022-01-01 ENCOUNTER — Encounter: Payer: Self-pay | Admitting: Hematology & Oncology

## 2022-01-01 VITALS — BP 113/78 | HR 92 | Temp 98.2°F | Resp 17

## 2022-01-01 DIAGNOSIS — Z5189 Encounter for other specified aftercare: Secondary | ICD-10-CM | POA: Diagnosis not present

## 2022-01-01 DIAGNOSIS — C50021 Malignant neoplasm of nipple and areola, right male breast: Secondary | ICD-10-CM | POA: Diagnosis not present

## 2022-01-01 DIAGNOSIS — C50921 Malignant neoplasm of unspecified site of right male breast: Secondary | ICD-10-CM

## 2022-01-01 DIAGNOSIS — Z5111 Encounter for antineoplastic chemotherapy: Secondary | ICD-10-CM | POA: Diagnosis not present

## 2022-01-01 DIAGNOSIS — Z170 Estrogen receptor positive status [ER+]: Secondary | ICD-10-CM | POA: Diagnosis not present

## 2022-01-01 MED ORDER — PEGFILGRASTIM-CBQV 6 MG/0.6ML ~~LOC~~ SOSY
6.0000 mg | PREFILLED_SYRINGE | Freq: Once | SUBCUTANEOUS | Status: AC
  Administered 2022-01-01: 6 mg via SUBCUTANEOUS
  Filled 2022-01-01: qty 0.6

## 2022-01-01 NOTE — Patient Instructions (Signed)

## 2022-01-05 ENCOUNTER — Encounter: Payer: Self-pay | Admitting: Hematology & Oncology

## 2022-01-13 DIAGNOSIS — Z6841 Body Mass Index (BMI) 40.0 and over, adult: Secondary | ICD-10-CM | POA: Diagnosis not present

## 2022-01-13 DIAGNOSIS — C773 Secondary and unspecified malignant neoplasm of axilla and upper limb lymph nodes: Secondary | ICD-10-CM | POA: Diagnosis not present

## 2022-01-13 DIAGNOSIS — Z170 Estrogen receptor positive status [ER+]: Secondary | ICD-10-CM | POA: Diagnosis not present

## 2022-01-13 DIAGNOSIS — R928 Other abnormal and inconclusive findings on diagnostic imaging of breast: Secondary | ICD-10-CM | POA: Diagnosis not present

## 2022-01-13 DIAGNOSIS — Z9221 Personal history of antineoplastic chemotherapy: Secondary | ICD-10-CM | POA: Diagnosis not present

## 2022-01-13 DIAGNOSIS — C50911 Malignant neoplasm of unspecified site of right female breast: Secondary | ICD-10-CM | POA: Diagnosis not present

## 2022-01-13 DIAGNOSIS — C50121 Malignant neoplasm of central portion of right male breast: Secondary | ICD-10-CM | POA: Diagnosis not present

## 2022-01-22 ENCOUNTER — Encounter: Payer: Self-pay | Admitting: Family Medicine

## 2022-01-22 ENCOUNTER — Other Ambulatory Visit: Payer: Self-pay | Admitting: Family Medicine

## 2022-01-22 ENCOUNTER — Ambulatory Visit (INDEPENDENT_AMBULATORY_CARE_PROVIDER_SITE_OTHER): Payer: BC Managed Care – PPO | Admitting: Family Medicine

## 2022-01-22 VITALS — BP 120/78 | HR 93 | Temp 98.0°F | Ht 68.0 in | Wt 316.2 lb

## 2022-01-22 DIAGNOSIS — E782 Mixed hyperlipidemia: Secondary | ICD-10-CM

## 2022-01-22 DIAGNOSIS — I10 Essential (primary) hypertension: Secondary | ICD-10-CM

## 2022-01-22 DIAGNOSIS — E669 Obesity, unspecified: Secondary | ICD-10-CM

## 2022-01-22 DIAGNOSIS — E1169 Type 2 diabetes mellitus with other specified complication: Secondary | ICD-10-CM

## 2022-01-22 LAB — LIPID PANEL
Cholesterol: 168 mg/dL (ref 0–200)
HDL: 41.2 mg/dL (ref >39.00)
LDL Cholesterol: 91 mg/dL (ref 0–99)
NonHDL: 126.69
Total CHOL/HDL Ratio: 4
Triglycerides: 180 mg/dL — ABNORMAL HIGH (ref 0.0–149.0)
VLDL: 36 mg/dL (ref 0.0–40.0)

## 2022-01-22 LAB — COMPREHENSIVE METABOLIC PANEL
ALT: 21 U/L (ref 0–53)
AST: 18 U/L (ref 0–37)
Albumin: 4.2 g/dL (ref 3.5–5.2)
Alkaline Phosphatase: 41 U/L (ref 39–117)
BUN: 14 mg/dL (ref 6–23)
CO2: 30 mEq/L (ref 19–32)
Calcium: 9.3 mg/dL (ref 8.4–10.5)
Chloride: 100 mEq/L (ref 96–112)
Creatinine, Ser: 0.83 mg/dL (ref 0.40–1.50)
GFR: 97.53 mL/min (ref >60.00)
Glucose, Bld: 98 mg/dL (ref 70–99)
Potassium: 4 mEq/L (ref 3.5–5.1)
Sodium: 137 mEq/L (ref 135–145)
Total Bilirubin: 0.5 mg/dL (ref 0.2–1.2)
Total Protein: 6.5 g/dL (ref 6.0–8.3)

## 2022-01-22 LAB — HEMOGLOBIN A1C: Hgb A1c MFr Bld: 6.3 % (ref 4.6–6.5)

## 2022-01-22 NOTE — Progress Notes (Signed)
Subjective:   Chief Complaint  Patient presents with   Follow-up    Noah Taylor is a 56 y.o. male here for follow-up of diabetes.   [facility]'s self monitored glucose range is low 100's.  Patient denies hypoglycemic reactions. He checks his glucose levels several times per week Patient does not require insulin.   Medications include: Metformin 1000 mg bid, Actos 30 mg/d, Farxiga 10 mg/d.  Diet is fair, on chemo for breast cancer and lost 25 lbs in 3 mo.  Exercise: none  Mixed Hyperlipidemia Patient presents for dyslipidemia follow up. Currently being treated with Pravastatin 20 mg/d, fenofibrate 48 mg/d and compliance with treatment thus far has been good. He denies myalgias. Diet/exercise as above.  The patient is not known to have coexisting coronary artery disease.  Hypertension Patient presents for hypertension follow up. He does monitor home blood pressures. Blood pressures ranging on average from 100's/60's. He is compliant with medications- HCTZ 25 mg/d, losartan 50 mg/d. Patient has these side effects of medication: none Diet/exercise as above.  No Cp or SOB.   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 ([facility]) 10/19/2021   Diabetes mellitus without complication (HCC)    Hyperlipidemia    Hypertension      Related testing: Retinal exam: Done Pneumovax: done  Objective:  BP 120/78   Pulse 93   Temp 98 F (36.7 C) (Oral)   Ht '5\' 8"'$  (1.727 m)   Wt (!) 316 lb 4 oz (143.5 kg)   SpO2 97%   BMI 48.09 kg/m  General:  Well developed, well nourished, in no apparent distress Lungs:  CTAB, no access msc use Cardio:  RRR, no bruits, no LE edema Psych: Age appropriate judgment and insight  Assessment:   Diabetes mellitus type 2 in obese ([place]) - Plan: Hemoglobin A1c  Mixed hyperlipidemia - Plan: Comprehensive metabolic panel, Lipid panel  Essential hypertension   Plan:   Chronic, unsure if  stable. Cont Metformin 1000 mg bid, Farxiga 10 mg/d, Actos 30 mg/d, may stop Actos depending on his A1c. Counseled on diet and exercise. He has lost 25 lbs in 3 mo 2/2 Chemo.  Chronic, unsure if stable. Ck lipids, cont fenofibrate 48 mg/d, pravastatin 20 mg/d.  Chronic, going too low. Stop HCTZ, cont losartan 50 mg/d. F/u in 3-6 mo. The patient voiced understanding and agreement to the plan.  [place], DO 01/22/22 7:54 AM

## 2022-01-22 NOTE — Patient Instructions (Signed)
Give [place] 2-3 business days to get the results of your labs back. Our follow up will be based on your results. We may stop your pioglitazone.   Keep the diet clean and stay active.  Keep an eye on your blood pressure in the next month or so. We are done with the hydrochlorothiazide for now.   Let us know if you need anything.

## 2022-02-01 DIAGNOSIS — Z170 Estrogen receptor positive status [ER+]: Secondary | ICD-10-CM | POA: Diagnosis not present

## 2022-02-01 DIAGNOSIS — C50121 Malignant neoplasm of central portion of right male breast: Secondary | ICD-10-CM | POA: Diagnosis not present

## 2022-02-01 DIAGNOSIS — Z6841 Body Mass Index (BMI) 40.0 and over, adult: Secondary | ICD-10-CM | POA: Diagnosis not present

## 2022-02-01 DIAGNOSIS — Z9221 Personal history of antineoplastic chemotherapy: Secondary | ICD-10-CM | POA: Diagnosis not present

## 2022-02-02 DIAGNOSIS — G8918 Other acute postprocedural pain: Secondary | ICD-10-CM | POA: Diagnosis not present

## 2022-02-02 DIAGNOSIS — G4733 Obstructive sleep apnea (adult) (pediatric): Secondary | ICD-10-CM | POA: Diagnosis not present

## 2022-02-02 DIAGNOSIS — E119 Type 2 diabetes mellitus without complications: Secondary | ICD-10-CM | POA: Diagnosis not present

## 2022-02-02 DIAGNOSIS — Z6841 Body Mass Index (BMI) 40.0 and over, adult: Secondary | ICD-10-CM | POA: Diagnosis not present

## 2022-02-02 DIAGNOSIS — C773 Secondary and unspecified malignant neoplasm of axilla and upper limb lymph nodes: Secondary | ICD-10-CM | POA: Diagnosis not present

## 2022-02-02 DIAGNOSIS — E669 Obesity, unspecified: Secondary | ICD-10-CM | POA: Diagnosis not present

## 2022-02-02 DIAGNOSIS — Z170 Estrogen receptor positive status [ER+]: Secondary | ICD-10-CM | POA: Diagnosis not present

## 2022-02-02 DIAGNOSIS — C50121 Malignant neoplasm of central portion of right male breast: Secondary | ICD-10-CM | POA: Diagnosis not present

## 2022-02-02 DIAGNOSIS — Z9221 Personal history of antineoplastic chemotherapy: Secondary | ICD-10-CM | POA: Diagnosis not present

## 2022-02-02 DIAGNOSIS — D242 Benign neoplasm of left breast: Secondary | ICD-10-CM | POA: Diagnosis not present

## 2022-02-03 ENCOUNTER — Encounter: Payer: Self-pay | Admitting: *Deleted

## 2022-02-03 NOTE — Progress Notes (Signed)
Patient had his mastectomy on 02/02/2022 in the [facility]. Will follow for path results.   Oncology Nurse Navigator Documentation     02/03/2022    9:45 AM  Oncology Nurse Navigator Flowsheets  Phase of Treatment Surgery  Surgery Actual Start Date: 02/02/2022  Navigator Follow Up Date: 02/08/2022  Navigator Follow Up Reason: Pathology  Navigator Location [facility]  Navigator Encounter Type Appt/Treatment Plan Review  Patient Visit Type MedOnc  Treatment Phase Active Tx  Barriers/Navigation Needs No Barriers At This Time  Interventions None Required  Acuity Level 1-No Barriers  Support Groups/Services Friends and Family  Time Spent with Patient 15

## 2022-02-08 ENCOUNTER — Encounter: Payer: Self-pay | Admitting: *Deleted

## 2022-02-08 NOTE — Progress Notes (Signed)
Reviewed pathology report with Dr Marin Olp. There are no further pathology needs at this time. Patient is already scheduled for follow up on 03/01/2022.  Oncology Nurse Navigator Documentation     02/08/2022    9:30 AM  Oncology Nurse Navigator Flowsheets  Navigator Follow Up Date: 03/01/2022  Navigator Follow Up Reason: Follow-up Appointment  Navigator Location [facility]  Navigator Encounter Type Pathology Review  Patient Visit Type MedOnc  Treatment Phase Active Tx  Barriers/Navigation Needs No Barriers At This Time  Interventions None Required  Acuity Level 1-No Barriers  Support Groups/Services Friends and Family  Time Spent with Patient 15

## 2022-02-15 ENCOUNTER — Other Ambulatory Visit: Payer: Self-pay

## 2022-02-21 ENCOUNTER — Other Ambulatory Visit: Payer: Self-pay | Admitting: Family Medicine

## 2022-02-23 ENCOUNTER — Other Ambulatory Visit: Payer: Self-pay

## 2022-03-01 ENCOUNTER — Inpatient Hospital Stay (HOSPITAL_BASED_OUTPATIENT_CLINIC_OR_DEPARTMENT_OTHER): Payer: BC Managed Care – PPO | Admitting: Hematology & Oncology

## 2022-03-01 ENCOUNTER — Inpatient Hospital Stay: Payer: BC Managed Care – PPO

## 2022-03-01 ENCOUNTER — Other Ambulatory Visit: Payer: Self-pay

## 2022-03-01 ENCOUNTER — Inpatient Hospital Stay: Payer: BC Managed Care – PPO | Attending: Hematology & Oncology

## 2022-03-01 ENCOUNTER — Encounter: Payer: Self-pay | Admitting: Hematology & Oncology

## 2022-03-01 ENCOUNTER — Ambulatory Visit: Payer: BC Managed Care – PPO

## 2022-03-01 VITALS — BP 136/92 | HR 93 | Temp 98.3°F | Resp 18 | Wt 316.0 lb

## 2022-03-01 DIAGNOSIS — Y839 Surgical procedure, unspecified as the cause of abnormal reaction of the patient, or of later complication, without mention of misadventure at the time of the procedure: Secondary | ICD-10-CM | POA: Diagnosis not present

## 2022-03-01 DIAGNOSIS — C50021 Malignant neoplasm of nipple and areola, right male breast: Secondary | ICD-10-CM | POA: Diagnosis not present

## 2022-03-01 DIAGNOSIS — C773 Secondary and unspecified malignant neoplasm of axilla and upper limb lymph nodes: Secondary | ICD-10-CM | POA: Diagnosis not present

## 2022-03-01 DIAGNOSIS — Z170 Estrogen receptor positive status [ER+]: Secondary | ICD-10-CM | POA: Diagnosis not present

## 2022-03-01 DIAGNOSIS — C50921 Malignant neoplasm of unspecified site of right male breast: Secondary | ICD-10-CM

## 2022-03-01 DIAGNOSIS — T8149XA Infection following a procedure, other surgical site, initial encounter: Secondary | ICD-10-CM | POA: Diagnosis not present

## 2022-03-01 DIAGNOSIS — C50121 Malignant neoplasm of central portion of right male breast: Secondary | ICD-10-CM | POA: Diagnosis not present

## 2022-03-01 LAB — LACTATE DEHYDROGENASE: LDH: 123 U/L (ref 98–192)

## 2022-03-01 LAB — CBC WITH DIFFERENTIAL (CANCER CENTER ONLY)
Abs Immature Granulocytes: 0.03 10*3/uL (ref 0.00–0.07)
Basophils Absolute: 0 10*3/uL (ref 0.0–0.1)
Basophils Relative: 0 %
Eosinophils Absolute: 0.2 10*3/uL (ref 0.0–0.5)
Eosinophils Relative: 2 %
HCT: 42.2 % (ref 39.0–52.0)
Hemoglobin: 13.6 g/dL (ref 13.0–17.0)
Immature Granulocytes: 0 %
Lymphocytes Relative: 16 %
Lymphs Abs: 1.7 10*3/uL (ref 0.7–4.0)
MCH: 26.9 pg (ref 26.0–34.0)
MCHC: 32.2 g/dL (ref 30.0–36.0)
MCV: 83.6 fL (ref 80.0–100.0)
Monocytes Absolute: 1.4 10*3/uL — ABNORMAL HIGH (ref 0.1–1.0)
Monocytes Relative: 14 %
Neutro Abs: 6.8 10*3/uL (ref 1.7–7.7)
Neutrophils Relative %: 68 %
Platelet Count: 346 10*3/uL (ref 150–400)
RBC: 5.05 MIL/uL (ref 4.22–5.81)
RDW: 13.4 % (ref 11.5–15.5)
WBC Count: 10.1 10*3/uL (ref 4.0–10.5)
nRBC: 0 % (ref 0.0–0.2)

## 2022-03-01 LAB — CMP (CANCER CENTER ONLY)
ALT: 21 U/L (ref 0–44)
AST: 15 U/L (ref 15–41)
Albumin: 4.1 g/dL (ref 3.5–5.0)
Alkaline Phosphatase: 64 U/L (ref 38–126)
Anion gap: 9 (ref 5–15)
BUN: 14 mg/dL (ref 6–20)
CO2: 25 mmol/L (ref 22–32)
Calcium: 10 mg/dL (ref 8.9–10.3)
Chloride: 102 mmol/L (ref 98–111)
Creatinine: 0.78 mg/dL (ref 0.61–1.24)
GFR, Estimated: >60 mL/min (ref >60)
Glucose, Bld: 149 mg/dL — ABNORMAL HIGH (ref 70–99)
Potassium: 4 mmol/L (ref 3.5–5.1)
Sodium: 136 mmol/L (ref 135–145)
Total Bilirubin: 0.4 mg/dL (ref 0.3–1.2)
Total Protein: 7 g/dL (ref 6.5–8.1)

## 2022-03-01 NOTE — Progress Notes (Signed)
Hematology and Oncology Follow Up Visit  Hence Noah Taylor [id number] November 02, 1965 56 y.o. 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  He did undergo a +and treatment. He is doing well and has tolerated treatment nicely so far. Today is cycle 4.  He is scheduled for right total on 02/02/2022.  This was done by Dr. Adair Laundry everything went quite well.  The pathology report ([id number]) showed a 4 cm residual mass.  It was grade 2.  All margins were negative.  There were 2 out of 14 lymph nodes that were still involved.  There is no extranodal extension.  By the newer staging criteria, he would be considered a pathologic stage IB ([id number]).  He did have a little bit of a fluid collection.  Has little bit of erythema associated with the right chest wall.  He has seen Radiation Oncology at [facility].  I think he will hopefully start radiation in the next week or so.  I think that he would be a very good candidate for antiestrogen therapy.  I realize that there was a new clinical trial that showed that adding ribociclib to antiestrogen therapy would improve outcome.  I think this would not be a bad idea for him.  He has had no problems with nausea or vomiting.  He has had no cough or shortness of breath.  He has had no change in bowel or bladder habits.  Currently, I would say performance status is probably ECOG 1.     Medications:  Allergies as of 03/01/2022   No Known Allergies      Medication List        Accurate as of March 01, 2022  8:23 AM. If you have any questions, ask your nurse or doctor.          famotidine 20 MG tablet Commonly known as: PEPCID Take by mouth 2 (two) times daily.   Farxiga 10  MG Tabs tablet Generic drug: dapagliflozin propanediol TAKE 1 TABLET DAILY   fenofibrate 48 MG tablet Commonly known as: TRICOR TAKE 1 TABLET DAILY   loratadine 10 MG tablet Commonly known as: CLARITIN Take 1 tablet by mouth daily.   losartan 50 MG tablet Commonly known as: COZAAR TAKE 1 TABLET DAILY   metFORMIN 1000 MG tablet Commonly known as: GLUCOPHAGE TAKE 1 TABLET TWICE A DAY WITH MEALS   ondansetron 8 MG tablet Commonly known as: Zofran Take 1 tablet (8 mg total) by mouth 2 (two) times daily as needed (Nausea or vomiting). Start on the third day after chemotherapy.   pravastatin 20 MG tablet Commonly known as: PRAVACHOL TAKE 1 TABLET DAILY   prochlorperazine 10 MG tablet Commonly known as: COMPAZINE Take 1 tablet (10 mg total) by mouth every 6 (six) hours as needed (Nausea or vomiting).        Allergies: No Known Allergies  Past Medical History, Surgical history, Social history, and Family History were reviewed and updated.  Review of Systems: Review of Systems  Constitutional: Negative.   HENT: Negative.    Eyes: Negative.   Respiratory: Negative.    Cardiovascular: Negative.   Gastrointestinal: Negative.   Genitourinary: Negative.   Musculoskeletal: Negative.   Skin: Negative.  Neurological: Negative.   Endo/Heme/Allergies: Negative.   Psychiatric/Behavioral: Negative.       Physical Exam:  vitals were not taken for this visit.   Wt Readings from Last 3 Encounters:  01/22/22 (!) 316 lb 4 oz (143.5 kg)  12/30/21 (!) 315 lb (142.9 kg)  12/09/21 (!) 324 lb 1.9 oz (147 kg)  Vital signs show temperature of 98.3.  Pulse 93.  Blood pressure 136/92.  Weight is 316 pounds.   Physical Exam Vitals reviewed.  Constitutional:      Comments: Breast exam shows bilateral mastectomies.  He does have little swollen erythema on the right chest wall.  He still has the surgical scar intact.  There is no tenderness.  There is little bit of warmth on the right  side.  He has no axillary adenopathy bilaterally.  HENT:     Head: Normocephalic and atraumatic.  Eyes:     Pupils: Pupils are equal, round, and reactive to light.  Cardiovascular:     Rate and Rhythm: Normal rate and regular rhythm.     Heart sounds: Normal heart sounds.  Pulmonary:     Effort: Pulmonary effort is normal.     Breath sounds: Normal breath sounds.  Abdominal:     General: Bowel sounds are normal.     Palpations: Abdomen is soft.  Musculoskeletal:        General: No tenderness or deformity. Normal range of motion.     Cervical back: Normal range of motion.  Lymphadenopathy:     Cervical: No cervical adenopathy.  Skin:    General: Skin is warm and dry.     Findings: No erythema or rash.  Neurological:     Mental Status: He is alert and oriented to person, place, and time.  Psychiatric:        Behavior: Behavior normal.        Thought Content: Thought content normal.        Judgment: Judgment normal.      Lab Results  Component Value Date   WBC 17.7 (H) 12/30/2021   HGB 14.0 12/30/2021   HCT 42.5 12/30/2021   MCV 80.8 12/30/2021   PLT 496 (H) 12/30/2021   No results found for: "FERRITIN", "IRON", "TIBC", "UIBC", "IRONPCTSAT" Lab Results  Component Value Date   RBC 5.26 12/30/2021   No results found for: "KPAFRELGTCHN", "LAMBDASER", "KAPLAMBRATIO" No results found for: "IGGSERUM", "IGA", "IGMSERUM" No results found for: "TOTALPROTELP", "ALBUMINELP", "A1GS", "A2GS", "BETS", "BETA2SER", "GAMS", "MSPIKE", "SPEI"   Chemistry      Component Value Date/Time   NA 137 01/22/2022 0750   K 4.0 01/22/2022 0750   CL 100 01/22/2022 0750   CO2 30 01/22/2022 0750   BUN 14 01/22/2022 0750   CREATININE 0.83 01/22/2022 0750   CREATININE 1.02 12/30/2021 0950      Component Value Date/Time   CALCIUM 9.3 01/22/2022 0750   ALKPHOS 41 01/22/2022 0750   AST 18 01/22/2022 0750   AST 15 12/30/2021 0950   ALT 21 01/22/2022 0750   ALT 20 12/30/2021 0950   BILITOT 0.5  01/22/2022 0750   BILITOT 0.5 12/30/2021 0950       Impression and Plan: Noah Taylor is a very pleasant 56 yo caucasian gentleman with locally advanced infiltrating ductal carcinoma of the right breast - retro areolar, ER+/PR+/HER2-.   He did have neoadjuvant therapy.  He did well with this.  He did have a decent response.  He now is a candidate for adjuvant therapy.  At this point, he will need radiation treatments.  He will see Radiation Oncology at [facility]  I think this is the best way at this point to help him.  Once radiation is completed, I would get a month antiestrogen therapy in addition to ribociclib.  Again I will have to check to see how this would fare with a male.  I do think that it would be reasonable to try Femara/ribociclib combination.  I do not see that we have to do any scans on him.  He will have his Port-A-Cath in place for a year.  I think this would be very reasonable.  I am so happy that he looks so good.  He really tolerated treatment nicely.  We gave him quite a bit of treatment.  He managed to tolerate side effects quite nicely.   Volanda Napoleon, MD 8/7/20238:23 AM

## 2022-03-01 NOTE — Patient Instructions (Signed)

## 2022-03-02 ENCOUNTER — Encounter: Payer: Self-pay | Admitting: *Deleted

## 2022-03-02 NOTE — Progress Notes (Signed)
Patient is recovering from his mastectomy. He does have some issues with seroma/cellulitis which his surgeon is managing. He has been referred to Radiation Oncology at [facility]. Will follow to ensure scheduling.   Oncology Nurse Navigator Documentation     03/02/2022   11:00 AM  Oncology Nurse Navigator Flowsheets  Navigator Follow Up Date: 03/08/2022  Navigator Follow Up Reason: Appointment Review  Navigator Location [facility]  Navigator Encounter Type Appt/Treatment Plan Review  Patient Visit Type MedOnc  Treatment Phase Active Tx  Barriers/Navigation Needs No Barriers At This Time  Interventions None Required  Acuity Level 1-No Barriers  Support Groups/Services Friends and Family  Time Spent with Patient 15

## 2022-03-05 DIAGNOSIS — M96843 Postprocedural seroma of a musculoskeletal structure following other procedure: Secondary | ICD-10-CM | POA: Diagnosis not present

## 2022-03-05 DIAGNOSIS — L7634 Postprocedural seroma of skin and subcutaneous tissue following other procedure: Secondary | ICD-10-CM | POA: Diagnosis not present

## 2022-03-05 DIAGNOSIS — Z9013 Acquired absence of bilateral breasts and nipples: Secondary | ICD-10-CM | POA: Diagnosis not present

## 2022-03-05 DIAGNOSIS — N6489 Other specified disorders of breast: Secondary | ICD-10-CM | POA: Diagnosis not present

## 2022-03-08 ENCOUNTER — Encounter: Payer: Self-pay | Admitting: *Deleted

## 2022-03-08 NOTE — Progress Notes (Signed)
Patient scheduled for consultation with [place] at [facility] on 03/18/22  Oncology Nurse Navigator Documentation     03/08/2022    8:00 AM  Oncology Nurse Navigator Flowsheets  Navigator Follow Up Date: 03/18/2022  Navigator Follow Up Reason: Radiation  Navigator Location [facility]  Navigator Encounter Type Appt/Treatment Plan Review  Patient Visit Type MedOnc  Treatment Phase Active Tx  Barriers/Navigation Needs No Barriers At This Time  Interventions None Required  Acuity Level 1-No Barriers  Support Groups/Services Friends and Family  Time Spent with Patient 15

## 2022-03-16 ENCOUNTER — Encounter: Payer: Self-pay | Admitting: Hematology & Oncology

## 2022-03-18 ENCOUNTER — Encounter: Payer: Self-pay | Admitting: *Deleted

## 2022-03-18 DIAGNOSIS — C50121 Malignant neoplasm of central portion of right male breast: Secondary | ICD-10-CM | POA: Diagnosis not present

## 2022-03-18 DIAGNOSIS — Z170 Estrogen receptor positive status [ER+]: Secondary | ICD-10-CM | POA: Diagnosis not present

## 2022-03-18 NOTE — Progress Notes (Signed)
Patient seen by radiation oncology today at [facility]. He is scheduled for simulation on 03/24/22.  Oncology Nurse Navigator Documentation     03/18/2022    2:45 PM  Oncology Nurse Navigator Flowsheets  Navigator Follow Up Date: 03/24/2022  Navigator Follow Up Reason: Radiation  Navigator Location [facility]  Navigator Encounter Type Appt/Treatment Plan Review  Patient Visit Type MedOnc  Treatment Phase Active Tx  Barriers/Navigation Needs No Barriers At This Time  Interventions None Required  Acuity Level 1-No Barriers  Support Groups/Services Friends and Family  Time Spent with Patient 15

## 2022-03-19 ENCOUNTER — Encounter: Payer: Self-pay | Admitting: Hematology & Oncology

## 2022-03-24 ENCOUNTER — Encounter: Payer: Self-pay | Admitting: *Deleted

## 2022-03-24 DIAGNOSIS — Z170 Estrogen receptor positive status [ER+]: Secondary | ICD-10-CM | POA: Diagnosis not present

## 2022-03-24 DIAGNOSIS — C50121 Malignant neoplasm of central portion of right male breast: Secondary | ICD-10-CM | POA: Diagnosis not present

## 2022-03-24 DIAGNOSIS — Z510 Encounter for antineoplastic radiation therapy: Secondary | ICD-10-CM | POA: Diagnosis not present

## 2022-03-24 NOTE — Progress Notes (Signed)
Patient simulated today to begin radiation therapy at outside health system [facility] [place].   Oncology Nurse Navigator Documentation     03/24/2022   10:30 AM  Oncology Nurse Navigator Flowsheets  Navigator Follow Up Date: 03/30/2022  Navigator Follow Up Reason: Follow-up Appointment  Navigator Location [facility]  Navigator Encounter Type Appt/Treatment Plan Review  Patient Visit Type MedOnc  Treatment Phase Active Tx  Barriers/Navigation Needs No Barriers At This Time  Interventions None Required  Acuity Level 1-No Barriers  Support Groups/Services Friends and Family  Time Spent with Patient 15

## 2022-03-30 ENCOUNTER — Inpatient Hospital Stay (HOSPITAL_BASED_OUTPATIENT_CLINIC_OR_DEPARTMENT_OTHER): Payer: BC Managed Care – PPO | Admitting: Hematology & Oncology

## 2022-03-30 ENCOUNTER — Encounter: Payer: Self-pay | Admitting: Hematology & Oncology

## 2022-03-30 ENCOUNTER — Inpatient Hospital Stay: Payer: BC Managed Care – PPO | Attending: Hematology & Oncology

## 2022-03-30 ENCOUNTER — Encounter: Payer: Self-pay | Admitting: *Deleted

## 2022-03-30 ENCOUNTER — Other Ambulatory Visit: Payer: Self-pay

## 2022-03-30 ENCOUNTER — Inpatient Hospital Stay: Payer: BC Managed Care – PPO

## 2022-03-30 VITALS — BP 139/75 | HR 77 | Temp 98.7°F | Resp 18 | Wt 321.0 lb

## 2022-03-30 VITALS — BP 139/75 | HR 77 | Temp 98.7°F | Resp 18

## 2022-03-30 DIAGNOSIS — C50921 Malignant neoplasm of unspecified site of right male breast: Secondary | ICD-10-CM | POA: Diagnosis not present

## 2022-03-30 DIAGNOSIS — Z170 Estrogen receptor positive status [ER+]: Secondary | ICD-10-CM | POA: Diagnosis not present

## 2022-03-30 DIAGNOSIS — C50021 Malignant neoplasm of nipple and areola, right male breast: Secondary | ICD-10-CM | POA: Diagnosis not present

## 2022-03-30 DIAGNOSIS — Z9013 Acquired absence of bilateral breasts and nipples: Secondary | ICD-10-CM | POA: Insufficient documentation

## 2022-03-30 DIAGNOSIS — Z9221 Personal history of antineoplastic chemotherapy: Secondary | ICD-10-CM | POA: Insufficient documentation

## 2022-03-30 DIAGNOSIS — C773 Secondary and unspecified malignant neoplasm of axilla and upper limb lymph nodes: Secondary | ICD-10-CM | POA: Diagnosis not present

## 2022-03-30 LAB — CBC WITH DIFFERENTIAL (CANCER CENTER ONLY)
Abs Immature Granulocytes: 0.02 10*3/uL (ref 0.00–0.07)
Basophils Absolute: 0.1 10*3/uL (ref 0.0–0.1)
Basophils Relative: 1 %
Eosinophils Absolute: 0.2 10*3/uL (ref 0.0–0.5)
Eosinophils Relative: 3 %
HCT: 49.2 % (ref 39.0–52.0)
Hemoglobin: 15.7 g/dL (ref 13.0–17.0)
Immature Granulocytes: 0 %
Lymphocytes Relative: 30 %
Lymphs Abs: 2.3 10*3/uL (ref 0.7–4.0)
MCH: 26.3 pg (ref 26.0–34.0)
MCHC: 31.9 g/dL (ref 30.0–36.0)
MCV: 82.6 fL (ref 80.0–100.0)
Monocytes Absolute: 0.9 10*3/uL (ref 0.1–1.0)
Monocytes Relative: 11 %
Neutro Abs: 4.4 10*3/uL (ref 1.7–7.7)
Neutrophils Relative %: 55 %
Platelet Count: 288 10*3/uL (ref 150–400)
RBC: 5.96 MIL/uL — ABNORMAL HIGH (ref 4.22–5.81)
RDW: 13.9 % (ref 11.5–15.5)
WBC Count: 7.9 10*3/uL (ref 4.0–10.5)
nRBC: 0 % (ref 0.0–0.2)

## 2022-03-30 LAB — CMP (CANCER CENTER ONLY)
ALT: 19 U/L (ref 0–44)
AST: 16 U/L (ref 15–41)
Albumin: 4.5 g/dL (ref 3.5–5.0)
Alkaline Phosphatase: 54 U/L (ref 38–126)
Anion gap: 10 (ref 5–15)
BUN: 16 mg/dL (ref 6–20)
CO2: 25 mmol/L (ref 22–32)
Calcium: 10.1 mg/dL (ref 8.9–10.3)
Chloride: 100 mmol/L (ref 98–111)
Creatinine: 0.83 mg/dL (ref 0.61–1.24)
GFR, Estimated: >60 mL/min (ref >60)
Glucose, Bld: 90 mg/dL (ref 70–99)
Potassium: 3.7 mmol/L (ref 3.5–5.1)
Sodium: 135 mmol/L (ref 135–145)
Total Bilirubin: 0.5 mg/dL (ref 0.3–1.2)
Total Protein: 7.5 g/dL (ref 6.5–8.1)

## 2022-03-30 LAB — LACTATE DEHYDROGENASE: LDH: 139 U/L (ref 98–192)

## 2022-03-30 MED ORDER — SODIUM CHLORIDE 0.9% FLUSH
10.0000 mL | INTRAVENOUS | Status: DC | PRN
  Administered 2022-03-30: 10 mL via INTRAVENOUS

## 2022-03-30 MED ORDER — HEPARIN SOD (PORK) LOCK FLUSH 100 UNIT/ML IV SOLN
500.0000 [IU] | Freq: Once | INTRAVENOUS | Status: AC
  Administered 2022-03-30: 500 [IU] via INTRAVENOUS

## 2022-03-30 NOTE — Progress Notes (Signed)
Hematology and Oncology Follow Up Visit  Noah Taylor [id number] Aug 18, 1965 56 y.o. 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  He has not yet started radiation therapy.  He is going to start I think later on this week or early next week.  It sounds like he will get 30 treatments and probably would not finish up until sometime in mid October.  He otherwise is doing okay.  He has had no further drainage out of the right mastectomy site.  He has had no problems with nausea or vomiting.  He is still working.  He has had no change in bowel or bladder habits.  He has had no leg swelling.  There is been no arm swelling with the right arm.  Currently, I would say his performance status is probably ECOG 0.  .     Medications:  Allergies as of 03/30/2022   No Known Allergies      Medication List        Accurate as of March 30, 2022  1:11 PM. If you have any questions, ask your nurse or doctor.          STOP taking these medications    famotidine 20 MG tablet Commonly known as: PEPCID Stopped by: Volanda Napoleon, MD   HYDROcodone-acetaminophen 5-325 MG tablet Commonly known as: NORCO/VICODIN Stopped by: Volanda Napoleon, MD   loratadine 10 MG tablet Commonly known as: CLARITIN Stopped by: Volanda Napoleon, MD       TAKE these medications    Farxiga 10 MG Tabs tablet Generic drug: dapagliflozin propanediol TAKE 1 TABLET DAILY   fenofibrate 48 MG tablet Commonly known as: TRICOR TAKE 1 TABLET DAILY   losartan 50 MG tablet Commonly known as: COZAAR TAKE 1 TABLET DAILY   metFORMIN 1000 MG tablet Commonly known as: GLUCOPHAGE TAKE 1 TABLET TWICE A DAY WITH MEALS   pravastatin 20 MG  tablet Commonly known as: PRAVACHOL TAKE 1 TABLET DAILY        Allergies: No Known Allergies  Past Medical History, Surgical history, Social history, and Family History were reviewed and updated.  Review of Systems: Review of Systems  Constitutional: Negative.   HENT: Negative.    Eyes: Negative.   Respiratory: Negative.    Cardiovascular: Negative.   Gastrointestinal: Negative.   Genitourinary: Negative.   Musculoskeletal: Negative.   Skin: Negative.   Neurological: Negative.   Endo/Heme/Allergies: Negative.   Psychiatric/Behavioral: Negative.       Physical Exam:  weight is 321 lb (145.6 kg) (abnormal). His oral temperature is 98.7 F (37.1 C). His blood pressure is 139/75 and his pulse is 77. His respiration is 18 and oxygen saturation is 96%.   Wt Readings from Last 3 Encounters:  03/30/22 (!) 321 lb (145.6 kg)  03/01/22 (!) 316 lb (143.3 kg)  01/22/22 (!) 316 lb 4 oz (143.5 kg)  Vital signs show temperature of 98.3.  Pulse 93.  Blood pressure 136/92.  Weight is 316 pounds.   Physical Exam Vitals reviewed.  Constitutional:      Comments: Breast exam shows bilateral mastectomies.  He does have little swollen erythema on the right chest wall.  He still has the  surgical scar intact.  There is no tenderness.  There is little bit of warmth on the right side.  He has no axillary adenopathy bilaterally.  HENT:     Head: Normocephalic and atraumatic.  Eyes:     Pupils: Pupils are equal, round, and reactive to light.  Cardiovascular:     Rate and Rhythm: Normal rate and regular rhythm.     Heart sounds: Normal heart sounds.  Pulmonary:     Effort: Pulmonary effort is normal.     Breath sounds: Normal breath sounds.  Abdominal:     General: Bowel sounds are normal.     Palpations: Abdomen is soft.  Musculoskeletal:        General: No tenderness or deformity. Normal range of motion.     Cervical back: Normal range of motion.  Lymphadenopathy:     Cervical: No  cervical adenopathy.  Skin:    General: Skin is warm and dry.     Findings: No erythema or rash.  Neurological:     Mental Status: He is alert and oriented to person, place, and time.  Psychiatric:        Behavior: Behavior normal.        Thought Content: Thought content normal.        Judgment: Judgment normal.      Lab Results  Component Value Date   WBC 7.9 03/30/2022   HGB 15.7 03/30/2022   HCT 49.2 03/30/2022   MCV 82.6 03/30/2022   PLT 288 03/30/2022   No results found for: "FERRITIN", "IRON", "TIBC", "UIBC", "IRONPCTSAT" Lab Results  Component Value Date   RBC 5.96 (H) 03/30/2022   No results found for: "KPAFRELGTCHN", "LAMBDASER", "KAPLAMBRATIO" No results found for: "IGGSERUM", "IGA", "IGMSERUM" No results found for: "TOTALPROTELP", "ALBUMINELP", "A1GS", "A2GS", "BETS", "BETA2SER", "GAMS", "MSPIKE", "SPEI"   Chemistry      Component Value Date/Time   NA 135 03/30/2022 1135   K 3.7 03/30/2022 1135   CL 100 03/30/2022 1135   CO2 25 03/30/2022 1135   BUN 16 03/30/2022 1135   CREATININE 0.83 03/30/2022 1135      Component Value Date/Time   CALCIUM 10.1 03/30/2022 1135   ALKPHOS 54 03/30/2022 1135   AST 16 03/30/2022 1135   ALT 19 03/30/2022 1135   BILITOT 0.5 03/30/2022 1135       Impression and Plan: Noah Taylor is a very pleasant 56 yo caucasian gentleman with locally advanced infiltrating ductal carcinoma of the right breast - retroareolar, ER+/PR+/HER2-.   He did have neoadjuvant therapy.  He did well with this.  He did have a decent response.  Again, we had to wait until he gets his adjuvant radiation therapy before we put him on antiestrogen therapy.  I will put him on at least Femara.  I will have to see if there is any studies that have utilized the CDK4/CDK6 inhibitors in men.  Hopefully, there will be some data that we can utilize to incorporate ribociclib with the Femara.  I will plan to see him back sometime in November.  By then, he should be  down with radiation.  He will have his Port-A-Cath flush we see him back.   Volanda Napoleon, MD 9/5/20231:11 PM

## 2022-03-30 NOTE — Progress Notes (Signed)
Patient will start radiation in the near future.  Oncology Nurse Navigator Documentation     03/30/2022   12:30 PM  Oncology Nurse Navigator Flowsheets  Phase of Treatment Radiation  Chemotherapy Pending- Reason: Oncologist Choice  Navigator Follow Up Date: 06/02/2022  Navigator Follow Up Reason: Follow-up Appointment  Navigator Location [facility]  Navigator Encounter Type Appt/Treatment Plan Review  Patient Visit Type MedOnc  Treatment Phase Active Tx  Barriers/Navigation Needs No Barriers At This Time  Interventions None Required  Acuity Level 1-No Barriers  Support Groups/Services Friends and Family  Time Spent with Patient 15

## 2022-03-30 NOTE — Patient Instructions (Signed)

## 2022-03-31 ENCOUNTER — Other Ambulatory Visit: Payer: BC Managed Care – PPO

## 2022-03-31 ENCOUNTER — Ambulatory Visit: Payer: BC Managed Care – PPO | Admitting: Hematology & Oncology

## 2022-04-01 ENCOUNTER — Encounter: Payer: Self-pay | Admitting: Hematology & Oncology

## 2022-04-01 DIAGNOSIS — C50921 Malignant neoplasm of unspecified site of right male breast: Secondary | ICD-10-CM

## 2022-04-01 MED ORDER — AMOXICILLIN 500 MG PO TABS: 2000.0000 mg | ORAL_TABLET | Freq: Once | ORAL | 3 refills | Status: AC

## 2022-04-02 DIAGNOSIS — Z170 Estrogen receptor positive status [ER+]: Secondary | ICD-10-CM | POA: Diagnosis not present

## 2022-04-02 DIAGNOSIS — Z510 Encounter for antineoplastic radiation therapy: Secondary | ICD-10-CM | POA: Diagnosis not present

## 2022-04-02 DIAGNOSIS — C50121 Malignant neoplasm of central portion of right male breast: Secondary | ICD-10-CM | POA: Diagnosis not present

## 2022-04-05 DIAGNOSIS — Z510 Encounter for antineoplastic radiation therapy: Secondary | ICD-10-CM | POA: Diagnosis not present

## 2022-04-05 DIAGNOSIS — C50121 Malignant neoplasm of central portion of right male breast: Secondary | ICD-10-CM | POA: Diagnosis not present

## 2022-04-05 DIAGNOSIS — Z170 Estrogen receptor positive status [ER+]: Secondary | ICD-10-CM | POA: Diagnosis not present

## 2022-04-06 DIAGNOSIS — Z170 Estrogen receptor positive status [ER+]: Secondary | ICD-10-CM | POA: Diagnosis not present

## 2022-04-06 DIAGNOSIS — Z510 Encounter for antineoplastic radiation therapy: Secondary | ICD-10-CM | POA: Diagnosis not present

## 2022-04-06 DIAGNOSIS — C50121 Malignant neoplasm of central portion of right male breast: Secondary | ICD-10-CM | POA: Diagnosis not present

## 2022-04-07 DIAGNOSIS — Z170 Estrogen receptor positive status [ER+]: Secondary | ICD-10-CM | POA: Diagnosis not present

## 2022-04-07 DIAGNOSIS — Z510 Encounter for antineoplastic radiation therapy: Secondary | ICD-10-CM | POA: Diagnosis not present

## 2022-04-07 DIAGNOSIS — C50121 Malignant neoplasm of central portion of right male breast: Secondary | ICD-10-CM | POA: Diagnosis not present

## 2022-04-08 DIAGNOSIS — Z170 Estrogen receptor positive status [ER+]: Secondary | ICD-10-CM | POA: Diagnosis not present

## 2022-04-08 DIAGNOSIS — Z510 Encounter for antineoplastic radiation therapy: Secondary | ICD-10-CM | POA: Diagnosis not present

## 2022-04-08 DIAGNOSIS — C50121 Malignant neoplasm of central portion of right male breast: Secondary | ICD-10-CM | POA: Diagnosis not present

## 2022-04-09 DIAGNOSIS — C50121 Malignant neoplasm of central portion of right male breast: Secondary | ICD-10-CM | POA: Diagnosis not present

## 2022-04-09 DIAGNOSIS — Z510 Encounter for antineoplastic radiation therapy: Secondary | ICD-10-CM | POA: Diagnosis not present

## 2022-04-09 DIAGNOSIS — Z170 Estrogen receptor positive status [ER+]: Secondary | ICD-10-CM | POA: Diagnosis not present

## 2022-04-12 DIAGNOSIS — Z170 Estrogen receptor positive status [ER+]: Secondary | ICD-10-CM | POA: Diagnosis not present

## 2022-04-12 DIAGNOSIS — Z510 Encounter for antineoplastic radiation therapy: Secondary | ICD-10-CM | POA: Diagnosis not present

## 2022-04-12 DIAGNOSIS — C50121 Malignant neoplasm of central portion of right male breast: Secondary | ICD-10-CM | POA: Diagnosis not present

## 2022-04-13 DIAGNOSIS — C50121 Malignant neoplasm of central portion of right male breast: Secondary | ICD-10-CM | POA: Diagnosis not present

## 2022-04-13 DIAGNOSIS — Z510 Encounter for antineoplastic radiation therapy: Secondary | ICD-10-CM | POA: Diagnosis not present

## 2022-04-13 DIAGNOSIS — Z170 Estrogen receptor positive status [ER+]: Secondary | ICD-10-CM | POA: Diagnosis not present

## 2022-04-14 DIAGNOSIS — Z170 Estrogen receptor positive status [ER+]: Secondary | ICD-10-CM | POA: Diagnosis not present

## 2022-04-14 DIAGNOSIS — Z510 Encounter for antineoplastic radiation therapy: Secondary | ICD-10-CM | POA: Diagnosis not present

## 2022-04-14 DIAGNOSIS — C50121 Malignant neoplasm of central portion of right male breast: Secondary | ICD-10-CM | POA: Diagnosis not present

## 2022-04-15 DIAGNOSIS — C50121 Malignant neoplasm of central portion of right male breast: Secondary | ICD-10-CM | POA: Diagnosis not present

## 2022-04-15 DIAGNOSIS — Z510 Encounter for antineoplastic radiation therapy: Secondary | ICD-10-CM | POA: Diagnosis not present

## 2022-04-15 DIAGNOSIS — Z170 Estrogen receptor positive status [ER+]: Secondary | ICD-10-CM | POA: Diagnosis not present

## 2022-04-16 DIAGNOSIS — Z510 Encounter for antineoplastic radiation therapy: Secondary | ICD-10-CM | POA: Diagnosis not present

## 2022-04-16 DIAGNOSIS — C50121 Malignant neoplasm of central portion of right male breast: Secondary | ICD-10-CM | POA: Diagnosis not present

## 2022-04-16 DIAGNOSIS — Z170 Estrogen receptor positive status [ER+]: Secondary | ICD-10-CM | POA: Diagnosis not present

## 2022-04-19 DIAGNOSIS — Z510 Encounter for antineoplastic radiation therapy: Secondary | ICD-10-CM | POA: Diagnosis not present

## 2022-04-19 DIAGNOSIS — C50121 Malignant neoplasm of central portion of right male breast: Secondary | ICD-10-CM | POA: Diagnosis not present

## 2022-04-19 DIAGNOSIS — Z170 Estrogen receptor positive status [ER+]: Secondary | ICD-10-CM | POA: Diagnosis not present

## 2022-04-20 DIAGNOSIS — Z170 Estrogen receptor positive status [ER+]: Secondary | ICD-10-CM | POA: Diagnosis not present

## 2022-04-20 DIAGNOSIS — C50121 Malignant neoplasm of central portion of right male breast: Secondary | ICD-10-CM | POA: Diagnosis not present

## 2022-04-20 DIAGNOSIS — Z510 Encounter for antineoplastic radiation therapy: Secondary | ICD-10-CM | POA: Diagnosis not present

## 2022-04-21 DIAGNOSIS — C50121 Malignant neoplasm of central portion of right male breast: Secondary | ICD-10-CM | POA: Diagnosis not present

## 2022-04-21 DIAGNOSIS — Z510 Encounter for antineoplastic radiation therapy: Secondary | ICD-10-CM | POA: Diagnosis not present

## 2022-04-21 DIAGNOSIS — Z170 Estrogen receptor positive status [ER+]: Secondary | ICD-10-CM | POA: Diagnosis not present

## 2022-04-22 DIAGNOSIS — Z510 Encounter for antineoplastic radiation therapy: Secondary | ICD-10-CM | POA: Diagnosis not present

## 2022-04-22 DIAGNOSIS — C50121 Malignant neoplasm of central portion of right male breast: Secondary | ICD-10-CM | POA: Diagnosis not present

## 2022-04-22 DIAGNOSIS — Z170 Estrogen receptor positive status [ER+]: Secondary | ICD-10-CM | POA: Diagnosis not present

## 2022-04-23 DIAGNOSIS — Z170 Estrogen receptor positive status [ER+]: Secondary | ICD-10-CM | POA: Diagnosis not present

## 2022-04-23 DIAGNOSIS — Z510 Encounter for antineoplastic radiation therapy: Secondary | ICD-10-CM | POA: Diagnosis not present

## 2022-04-23 DIAGNOSIS — C50121 Malignant neoplasm of central portion of right male breast: Secondary | ICD-10-CM | POA: Diagnosis not present

## 2022-04-25 ENCOUNTER — Encounter: Payer: Self-pay | Admitting: Family Medicine

## 2022-04-26 DIAGNOSIS — C50121 Malignant neoplasm of central portion of right male breast: Secondary | ICD-10-CM | POA: Diagnosis not present

## 2022-04-26 DIAGNOSIS — Z510 Encounter for antineoplastic radiation therapy: Secondary | ICD-10-CM | POA: Diagnosis not present

## 2022-04-26 DIAGNOSIS — Z170 Estrogen receptor positive status [ER+]: Secondary | ICD-10-CM | POA: Diagnosis not present

## 2022-04-27 DIAGNOSIS — Z510 Encounter for antineoplastic radiation therapy: Secondary | ICD-10-CM | POA: Diagnosis not present

## 2022-04-27 DIAGNOSIS — C50121 Malignant neoplasm of central portion of right male breast: Secondary | ICD-10-CM | POA: Diagnosis not present

## 2022-04-27 DIAGNOSIS — Z170 Estrogen receptor positive status [ER+]: Secondary | ICD-10-CM | POA: Diagnosis not present

## 2022-04-28 DIAGNOSIS — Z170 Estrogen receptor positive status [ER+]: Secondary | ICD-10-CM | POA: Diagnosis not present

## 2022-04-28 DIAGNOSIS — C50121 Malignant neoplasm of central portion of right male breast: Secondary | ICD-10-CM | POA: Diagnosis not present

## 2022-04-28 DIAGNOSIS — Z510 Encounter for antineoplastic radiation therapy: Secondary | ICD-10-CM | POA: Diagnosis not present

## 2022-04-29 DIAGNOSIS — Z510 Encounter for antineoplastic radiation therapy: Secondary | ICD-10-CM | POA: Diagnosis not present

## 2022-04-29 DIAGNOSIS — C50121 Malignant neoplasm of central portion of right male breast: Secondary | ICD-10-CM | POA: Diagnosis not present

## 2022-04-29 DIAGNOSIS — Z170 Estrogen receptor positive status [ER+]: Secondary | ICD-10-CM | POA: Diagnosis not present

## 2022-04-30 DIAGNOSIS — C50121 Malignant neoplasm of central portion of right male breast: Secondary | ICD-10-CM | POA: Diagnosis not present

## 2022-04-30 DIAGNOSIS — Z170 Estrogen receptor positive status [ER+]: Secondary | ICD-10-CM | POA: Diagnosis not present

## 2022-04-30 DIAGNOSIS — Z510 Encounter for antineoplastic radiation therapy: Secondary | ICD-10-CM | POA: Diagnosis not present

## 2022-05-03 DIAGNOSIS — Z510 Encounter for antineoplastic radiation therapy: Secondary | ICD-10-CM | POA: Diagnosis not present

## 2022-05-03 DIAGNOSIS — C50121 Malignant neoplasm of central portion of right male breast: Secondary | ICD-10-CM | POA: Diagnosis not present

## 2022-05-03 DIAGNOSIS — Z170 Estrogen receptor positive status [ER+]: Secondary | ICD-10-CM | POA: Diagnosis not present

## 2022-05-04 DIAGNOSIS — C50121 Malignant neoplasm of central portion of right male breast: Secondary | ICD-10-CM | POA: Diagnosis not present

## 2022-05-04 DIAGNOSIS — Z170 Estrogen receptor positive status [ER+]: Secondary | ICD-10-CM | POA: Diagnosis not present

## 2022-05-04 DIAGNOSIS — Z510 Encounter for antineoplastic radiation therapy: Secondary | ICD-10-CM | POA: Diagnosis not present

## 2022-05-05 ENCOUNTER — Encounter: Payer: BC Managed Care – PPO | Admitting: Family Medicine

## 2022-05-05 DIAGNOSIS — C50121 Malignant neoplasm of central portion of right male breast: Secondary | ICD-10-CM | POA: Diagnosis not present

## 2022-05-05 DIAGNOSIS — Z170 Estrogen receptor positive status [ER+]: Secondary | ICD-10-CM | POA: Diagnosis not present

## 2022-05-05 DIAGNOSIS — Z510 Encounter for antineoplastic radiation therapy: Secondary | ICD-10-CM | POA: Diagnosis not present

## 2022-05-06 DIAGNOSIS — Z170 Estrogen receptor positive status [ER+]: Secondary | ICD-10-CM | POA: Diagnosis not present

## 2022-05-06 DIAGNOSIS — C50121 Malignant neoplasm of central portion of right male breast: Secondary | ICD-10-CM | POA: Diagnosis not present

## 2022-05-06 DIAGNOSIS — Z510 Encounter for antineoplastic radiation therapy: Secondary | ICD-10-CM | POA: Diagnosis not present

## 2022-05-07 DIAGNOSIS — Z510 Encounter for antineoplastic radiation therapy: Secondary | ICD-10-CM | POA: Diagnosis not present

## 2022-05-07 DIAGNOSIS — Z170 Estrogen receptor positive status [ER+]: Secondary | ICD-10-CM | POA: Diagnosis not present

## 2022-05-07 DIAGNOSIS — C50121 Malignant neoplasm of central portion of right male breast: Secondary | ICD-10-CM | POA: Diagnosis not present

## 2022-05-10 DIAGNOSIS — C50121 Malignant neoplasm of central portion of right male breast: Secondary | ICD-10-CM | POA: Diagnosis not present

## 2022-05-10 DIAGNOSIS — Z510 Encounter for antineoplastic radiation therapy: Secondary | ICD-10-CM | POA: Diagnosis not present

## 2022-05-10 DIAGNOSIS — Z170 Estrogen receptor positive status [ER+]: Secondary | ICD-10-CM | POA: Diagnosis not present

## 2022-05-11 DIAGNOSIS — Z170 Estrogen receptor positive status [ER+]: Secondary | ICD-10-CM | POA: Diagnosis not present

## 2022-05-11 DIAGNOSIS — C50121 Malignant neoplasm of central portion of right male breast: Secondary | ICD-10-CM | POA: Diagnosis not present

## 2022-05-11 DIAGNOSIS — Z510 Encounter for antineoplastic radiation therapy: Secondary | ICD-10-CM | POA: Diagnosis not present

## 2022-05-12 DIAGNOSIS — Z170 Estrogen receptor positive status [ER+]: Secondary | ICD-10-CM | POA: Diagnosis not present

## 2022-05-12 DIAGNOSIS — Z510 Encounter for antineoplastic radiation therapy: Secondary | ICD-10-CM | POA: Diagnosis not present

## 2022-05-12 DIAGNOSIS — C50121 Malignant neoplasm of central portion of right male breast: Secondary | ICD-10-CM | POA: Diagnosis not present

## 2022-05-13 DIAGNOSIS — Z170 Estrogen receptor positive status [ER+]: Secondary | ICD-10-CM | POA: Diagnosis not present

## 2022-05-13 DIAGNOSIS — Z510 Encounter for antineoplastic radiation therapy: Secondary | ICD-10-CM | POA: Diagnosis not present

## 2022-05-13 DIAGNOSIS — C50121 Malignant neoplasm of central portion of right male breast: Secondary | ICD-10-CM | POA: Diagnosis not present

## 2022-05-14 DIAGNOSIS — C50121 Malignant neoplasm of central portion of right male breast: Secondary | ICD-10-CM | POA: Diagnosis not present

## 2022-05-14 DIAGNOSIS — Z170 Estrogen receptor positive status [ER+]: Secondary | ICD-10-CM | POA: Diagnosis not present

## 2022-05-14 DIAGNOSIS — Z510 Encounter for antineoplastic radiation therapy: Secondary | ICD-10-CM | POA: Diagnosis not present

## 2022-05-17 DIAGNOSIS — Z170 Estrogen receptor positive status [ER+]: Secondary | ICD-10-CM | POA: Diagnosis not present

## 2022-05-17 DIAGNOSIS — Z510 Encounter for antineoplastic radiation therapy: Secondary | ICD-10-CM | POA: Diagnosis not present

## 2022-05-17 DIAGNOSIS — C50121 Malignant neoplasm of central portion of right male breast: Secondary | ICD-10-CM | POA: Diagnosis not present

## 2022-06-02 ENCOUNTER — Encounter: Payer: Self-pay | Admitting: Hematology & Oncology

## 2022-06-02 ENCOUNTER — Inpatient Hospital Stay: Payer: BC Managed Care – PPO | Attending: Hematology & Oncology

## 2022-06-02 ENCOUNTER — Inpatient Hospital Stay: Payer: BC Managed Care – PPO

## 2022-06-02 ENCOUNTER — Inpatient Hospital Stay (HOSPITAL_BASED_OUTPATIENT_CLINIC_OR_DEPARTMENT_OTHER): Payer: BC Managed Care – PPO | Admitting: Hematology & Oncology

## 2022-06-02 ENCOUNTER — Other Ambulatory Visit: Payer: Self-pay

## 2022-06-02 VITALS — BP 119/76 | HR 86 | Temp 98.4°F | Resp 18 | Ht 68.0 in | Wt 329.0 lb

## 2022-06-02 DIAGNOSIS — C50921 Malignant neoplasm of unspecified site of right male breast: Secondary | ICD-10-CM

## 2022-06-02 DIAGNOSIS — C50021 Malignant neoplasm of nipple and areola, right male breast: Secondary | ICD-10-CM | POA: Diagnosis not present

## 2022-06-02 DIAGNOSIS — Z923 Personal history of irradiation: Secondary | ICD-10-CM | POA: Diagnosis not present

## 2022-06-02 DIAGNOSIS — C773 Secondary and unspecified malignant neoplasm of axilla and upper limb lymph nodes: Secondary | ICD-10-CM | POA: Diagnosis not present

## 2022-06-02 DIAGNOSIS — Z170 Estrogen receptor positive status [ER+]: Secondary | ICD-10-CM | POA: Diagnosis not present

## 2022-06-02 DIAGNOSIS — L598 Other specified disorders of the skin and subcutaneous tissue related to radiation: Secondary | ICD-10-CM | POA: Insufficient documentation

## 2022-06-02 LAB — CMP (CANCER CENTER ONLY)
ALT: 28 U/L (ref 0–44)
AST: 18 U/L (ref 15–41)
Albumin: 4.4 g/dL (ref 3.5–5.0)
Alkaline Phosphatase: 51 U/L (ref 38–126)
Anion gap: 9 (ref 5–15)
BUN: 17 mg/dL (ref 6–20)
CO2: 27 mmol/L (ref 22–32)
Calcium: 10 mg/dL (ref 8.9–10.3)
Chloride: 100 mmol/L (ref 98–111)
Creatinine: 0.87 mg/dL (ref 0.61–1.24)
GFR, Estimated: >60 mL/min (ref >60)
Glucose, Bld: 135 mg/dL — ABNORMAL HIGH (ref 70–99)
Potassium: 3.8 mmol/L (ref 3.5–5.1)
Sodium: 136 mmol/L (ref 135–145)
Total Bilirubin: 0.5 mg/dL (ref 0.3–1.2)
Total Protein: 7.3 g/dL (ref 6.5–8.1)

## 2022-06-02 LAB — CBC WITH DIFFERENTIAL (CANCER CENTER ONLY)
Abs Immature Granulocytes: 0.05 10*3/uL (ref 0.00–0.07)
Basophils Absolute: 0 10*3/uL (ref 0.0–0.1)
Basophils Relative: 0 %
Eosinophils Absolute: 0.2 10*3/uL (ref 0.0–0.5)
Eosinophils Relative: 2 %
HCT: 48.2 % (ref 39.0–52.0)
Hemoglobin: 15.8 g/dL (ref 13.0–17.0)
Immature Granulocytes: 1 %
Lymphocytes Relative: 11 %
Lymphs Abs: 0.9 10*3/uL (ref 0.7–4.0)
MCH: 25.9 pg — ABNORMAL LOW (ref 26.0–34.0)
MCHC: 32.8 g/dL (ref 30.0–36.0)
MCV: 79 fL — ABNORMAL LOW (ref 80.0–100.0)
Monocytes Absolute: 1 10*3/uL (ref 0.1–1.0)
Monocytes Relative: 12 %
Neutro Abs: 6.3 10*3/uL (ref 1.7–7.7)
Neutrophils Relative %: 74 %
Platelet Count: 279 10*3/uL (ref 150–400)
RBC: 6.1 MIL/uL — ABNORMAL HIGH (ref 4.22–5.81)
RDW: 15.7 % — ABNORMAL HIGH (ref 11.5–15.5)
WBC Count: 8.5 10*3/uL (ref 4.0–10.5)
nRBC: 0 % (ref 0.0–0.2)

## 2022-06-02 MED ORDER — RIBOCICLIB SUCC (400 MG DOSE) 200 MG PO TBPK
400.0000 mg | ORAL_TABLET | Freq: Every day | ORAL | 12 refills | Status: DC
  Filled 2022-06-02: qty 42, 21d supply, fill #0

## 2022-06-02 MED ORDER — LETROZOLE 2.5 MG PO TABS: 2.5000 mg | ORAL_TABLET | Freq: Every day | ORAL | 8 refills | Status: DC

## 2022-06-02 NOTE — Patient Instructions (Signed)

## 2022-06-02 NOTE — Progress Notes (Signed)
Hematology and Oncology Follow Up Visit  Noah Taylor [id number] 1966/01/31 56 y.o. 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  He completed his radiation therapy.  He tolerated this well.  He did have some skin breakdown.  Over on the right chest wall.  This seems to be healing up.  He is still working.  He is trying to stay active.  He has had really had no arm swelling on the right side.  We are now ready for adjuvant hormonal therapy.  I think would be a good idea to have him on Femara along with ribociclib.  Given the fact that he did have 2 positive lymph nodes after neoadjuvant chemotherapy, this is I think would put him at a high risk for recurrence.  As such, I think that he would be a good candidate for adjuvant therapy with Femara and ribociclib.  I think that the recent clinical trial that was NATALEE show that there was a benefit to the combination in patients who have higher risk early stage breast cancer.  I do not see a downside to put him on both agents.  He has had no problem with fever.  There is been no problems with bowels or bladder.  He has had no cough or shortness of breath.  There has been no nausea.  He has had no leg swelling.  He has had no lymphedema of the right arm.  Overall, I would say that his performance status is probably ECOG 1.     Medications:  Allergies as of 06/02/2022   No Known Allergies      Medication List        Accurate as of June 02, 2022  5:28 PM. If you have any questions, ask your nurse or doctor.          Farxiga 10 MG Tabs  tablet Generic drug: dapagliflozin propanediol TAKE 1 TABLET DAILY   fenofibrate 48 MG tablet Commonly known as: TRICOR TAKE 1 TABLET DAILY   letrozole 2.5 MG tablet Commonly known as: FEMARA Take 1 tablet (2.5 mg total) by mouth daily. Started by: Volanda Napoleon, MD   losartan 50 MG tablet Commonly known as: COZAAR TAKE 1 TABLET DAILY   metFORMIN 1000 MG tablet Commonly known as: GLUCOPHAGE TAKE 1 TABLET TWICE A DAY WITH MEALS   pravastatin 20 MG tablet Commonly known as: PRAVACHOL TAKE 1 TABLET DAILY   ribociclib succ 200 MG Therapy Pack Commonly known as: KISQALI 418m Daily Dose Take 2 tablets (400 mg total) by mouth daily. Take for 21 days on, 7 days off, repeat every 28 days. Started by: PVolanda Napoleon MD   silver sulfADIAZINE 1 % cream Commonly known as: SILVADENE Apply to affected area 2-3 times daily.   triamcinolone cream 0.1 % Commonly known as: KENALOG Apply topically 2 (two) times daily.        Allergies: No Known Allergies  Past Medical History, Surgical history, Social history, and Family History were reviewed and updated.  Review of Systems: Review of Systems  Constitutional: Negative.   HENT: Negative.    Eyes: Negative.   Respiratory: Negative.  Cardiovascular: Negative.   Gastrointestinal: Negative.   Genitourinary: Negative.   Musculoskeletal: Negative.   Skin: Negative.   Neurological: Negative.   Endo/Heme/Allergies: Negative.   Psychiatric/Behavioral: Negative.       Physical Exam:  height is _0  (1.727 m) and weight is 329 lb (149.2 kg) (abnormal). His oral temperature is 98.4 F (36.9 C). His blood pressure is 119/76 and his pulse is 86. His respiration is 18 and oxygen saturation is 97%.   Wt Readings from Last 3 Encounters:  06/02/22 (!) 329 lb (149.2 kg)  03/30/22 (!) 321 lb (145.6 kg)  03/01/22 (!) 316 lb (143.3 kg)  Vital signs show temperature of 98.3.  Pulse 93.  Blood pressure 136/92.  Weight is 316  pounds.   Physical Exam Vitals reviewed.  Constitutional:      Comments: Breast exam shows bilateral mastectomies.  He does have little swollen erythema on the right chest wall.  He still has the surgical scar intact.  There is no tenderness.  There is little bit of warmth on the right side.  He has no axillary adenopathy bilaterally.  HENT:     Head: Normocephalic and atraumatic.  Eyes:     Pupils: Pupils are equal, round, and reactive to light.  Cardiovascular:     Rate and Rhythm: Normal rate and regular rhythm.     Heart sounds: Normal heart sounds.  Pulmonary:     Effort: Pulmonary effort is normal.     Breath sounds: Normal breath sounds.  Abdominal:     General: Bowel sounds are normal.     Palpations: Abdomen is soft.  Musculoskeletal:        General: No tenderness or deformity. Normal range of motion.     Cervical back: Normal range of motion.  Lymphadenopathy:     Cervical: No cervical adenopathy.  Skin:    General: Skin is warm and dry.     Findings: No erythema or rash.  Neurological:     Mental Status: He is alert and oriented to person, place, and time.  Psychiatric:        Behavior: Behavior normal.        Thought Content: Thought content normal.        Judgment: Judgment normal.      Lab Results  Component Value Date   WBC 8.5 06/02/2022   HGB 15.8 06/02/2022   HCT 48.2 06/02/2022   MCV 79.0 (L) 06/02/2022   PLT 279 06/02/2022   No results found for: "FERRITIN", "IRON", "TIBC", "UIBC", "IRONPCTSAT" Lab Results  Component Value Date   RBC 6.10 (H) 06/02/2022   No results found for: "KPAFRELGTCHN", "LAMBDASER", "KAPLAMBRATIO" No results found for: "IGGSERUM", "IGA", "IGMSERUM" No results found for: "TOTALPROTELP", "ALBUMINELP", "A1GS", "A2GS", "BETS", "BETA2SER", "GAMS", "MSPIKE", "SPEI"   Chemistry      Component Value Date/Time   NA 136 06/02/2022 0805   K 3.8 06/02/2022 0805   CL 100 06/02/2022 0805   CO2 27 06/02/2022 0805   BUN 17  06/02/2022 0805   CREATININE 0.87 06/02/2022 0805      Component Value Date/Time   CALCIUM 10.0 06/02/2022 0805   ALKPHOS 51 06/02/2022 0805   AST 18 06/02/2022 0805   ALT 28 06/02/2022 0805   BILITOT 0.5 06/02/2022 0805       Impression and Plan: Noah Taylor is a very pleasant 56 yo caucasian gentleman with locally advanced infiltrating ductal carcinoma of the right breast - retroareolar, ER+/PR+/HER2-.   He did have  neoadjuvant therapy.  He did well with this.  He did have a decent response.  Again, he completed radiation therapy.  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  I do think that he would be a good candidate for the ribociclib/Femara protocol.  The dose of ribociclib will be 400 mg daily (21 days on/7 days off) and this will be given for 3 years.  The Femara will be given for I think 7 years.  Again, I really do not see that there is much for downside.  I do not think there will be a lot of toxicity.  We will hopefully be able to start treatment in a week.  I will send the ribociclib to our [facility].  I would like to see him back in about 5 weeks.  By then, he would have been on the combination for 4 weeks and we will see how he is doing.  He does have the Port-A-Cath in.  I will keep this in for a year.  He will have this flushed when we see him back.   Volanda Napoleon, MD 11/8/20235:28 PM

## 2022-06-03 ENCOUNTER — Encounter: Payer: Self-pay | Admitting: *Deleted

## 2022-06-03 ENCOUNTER — Encounter: Payer: Self-pay | Admitting: Hematology & Oncology

## 2022-06-03 ENCOUNTER — Telehealth: Payer: Self-pay | Admitting: Pharmacist

## 2022-06-03 ENCOUNTER — Telehealth: Payer: Self-pay

## 2022-06-03 ENCOUNTER — Other Ambulatory Visit (HOSPITAL_COMMUNITY): Payer: Self-pay

## 2022-06-03 NOTE — Progress Notes (Signed)
Patient has completed Radiation and now will start AI/Kisqali.   Due to his insurance requirements, he will also start a GnRH analog to obtain authorization.   Oncology Nurse Navigator Documentation     06/03/2022    7:30 AM  Oncology Nurse Navigator Flowsheets  Phase of Treatment AI  AI Pending Reason: Authorization  Radiation Actual Start Date: 04/06/2022  Radiation Actual End Date: 05/17/2022  Navigator Follow Up Date: 07/08/2022  Navigator Follow Up Reason: Follow-up Appointment  Navigator Location [facility]  Navigator Encounter Type Appt/Treatment Plan Review  Patient Visit Type MedOnc  Treatment Phase Active Tx  Barriers/Navigation Needs No Barriers At This Time  Interventions None Required  Acuity Level 1-No Barriers  Support Groups/Services Friends and Family  Time Spent with Patient 15

## 2022-06-03 NOTE — Telephone Encounter (Signed)
Oral Oncology Patient Advocate Encounter  Received notification that the request for prior authorization for Noah Taylor has been denied due to: Reason for Denial (in whole or in part): Coverage is provided in situations where the requested medication will be used in combination  with fulvestrant; OR in combination with anastrozole, exemestane, or letrozole AND a  gonadotropin-releasing hormone (GnRH) analog. Examples of a GnRH analog are leuprolide  acetate, Lupron Depot (leuprolide acetate intramuscular injection), Trelstar (triptorelin pamoate  intramuscular injection), Zoladex (goserelin acetate subcutaneous implant), Firmagon (degarelix  acetate subcutaneous injection), Orgovyx (relugolix tablet). Coverage cannot be authorized at this time.     [name], [facility] Patient [place]  [phone] (phone) [phone] (fax) 06/03/2022 11:09 AM

## 2022-06-03 NOTE — Telephone Encounter (Signed)
Oral Oncology Patient Advocate Encounter   Received notification that prior authorization for [name] is required.   PA submitted on 11.09.23  Key [id number]  Status is pending     [name], [name]  [phone] (phone) [phone] (fax) 06/03/2022 9:31 AM

## 2022-06-04 NOTE — Telephone Encounter (Signed)
Oral Chemotherapy Pharmacist Encounter   [name] has been denied for use in patient unless patient is also on concomitant  aromatase inhibitor AND GnRH analog (I.e. Zoladex).  Dr. Marin Olp has been notified of the above information - awaiting decision from MD before proceeding with appeal process with patient's insurance at this time.   Oral Oncology Clinic will continue to follow.   Leron Croak, PharmD, BCPS, [facility] Hematology/Oncology Clinical Pharmacist [facility] and [place] [phone] 06/04/2022 10:43 AM

## 2022-06-07 ENCOUNTER — Telehealth: Payer: Self-pay | Admitting: Hematology & Oncology

## 2022-06-07 NOTE — Telephone Encounter (Signed)
Contacted pt to schedule appts per 06/02/22 LOS. Unable to reach via phone, voicemail was left.  

## 2022-06-07 NOTE — Telephone Encounter (Signed)
Oral Oncology Patient Advocate Encounter   An urgent appeal for the prior authorization denial of [name] has been started by the pharmacist on 06/07/22.   This encounter will continue to be updated until final appeal determination.      [name], [facility] [facility] Patient [place]  [phone] (phone) [phone] (fax) 06/07/2022 3:13 PM

## 2022-06-07 NOTE — Telephone Encounter (Signed)
Oral Chemotherapy Pharmacist Encounter   Confirmed with Dr. Marin Olp that he is planning to add a GnRH analog to patient's treatment plan in addition to the letrozole and Kisqali.   Expedited e-appeal submitted through CoverMyMeds. Oral Oncology Clinic will continue to follow.   Leron Croak, PharmD, BCPS, [facility] Hematology/Oncology Clinical Pharmacist [facility] and [place] [phone] 06/07/2022 12:29 PM

## 2022-06-08 NOTE — Telephone Encounter (Signed)
Oral Oncology Patient Advocate Encounter  Received notification that the appeal request for prior authorization for [name] has been denied due to: Patient does not have recurrent or metastatic disease.     [name], [facility] [facility] Patient [place]  [phone] (phone) [phone] (fax) 06/08/2022 8:21 AM

## 2022-06-09 NOTE — Telephone Encounter (Signed)
Oral Chemotherapy Pharmacist Encounter   Additional information submitted to [employer] regarding prior authorization denial for Kisqali. Information faxed to [phone] on 06/08/22 for additional review.  Also working on formal level 1 appeal process for Kisqali if the additional information submitted above is denied.   Leron Croak, PharmD, BCPS, [facility] Hematology/Oncology Clinical Pharmacist [facility] and [place] [phone] 06/09/2022 9:17 AM

## 2022-06-14 NOTE — Telephone Encounter (Signed)
Called to check status of appeal sent in on 06/09/22. Informed that it was received on the 16th and was currently in processing. I requested that they notate it was urgent and was informed that that was done. I will continue to follow and update.   [employer] phone number: [phone]  Noah Taylor, [place] Patient [place]  [phone] (phone) [phone] (fax) 06/14/2022 10:46 AM

## 2022-06-15 ENCOUNTER — Other Ambulatory Visit (HOSPITAL_COMMUNITY): Payer: Self-pay

## 2022-06-15 NOTE — Telephone Encounter (Signed)
Oral Oncology Pharmacist Encounter   Notified that appeal for [name] has been upheld and remains denied due to patient not having metastatic disease.   Dr. Marin Olp notified of the above. Oral chemotherapy clinic will await further direction from MD on next steps at this point.   Leron Croak, PharmD, BCPS, [facility] Hematology/Oncology Clinical Pharmacist [facility] and [place] [phone] 06/15/2022 11:48 AM

## 2022-06-15 NOTE — Telephone Encounter (Signed)
Oral Oncology Patient Advocate Encounter  Appeal for Prior Authorization for [name] was denied. We will need to reach out to the MD to change therapy.   [name], [place] [facility] Patient [place]  [phone] (phone) [phone] (fax) 06/15/2022 11:51 AM

## 2022-06-16 NOTE — Telephone Encounter (Signed)
Oral Oncology Pharmacist Encounter  Dr. Marin Olp aware of Kisqali denial for patient and wants to proceed with external 2nd level appeal. MD office provided with phone number (tele: [phone]), case ID [id number], and patient ID: [id number] for MD to complete peer-to-peer.   Leron Croak, PharmD, BCPS, [facility] Hematology/Oncology Clinical Pharmacist [facility] and [place] [phone] 06/16/2022 3:59 PM

## 2022-06-21 ENCOUNTER — Telehealth: Payer: Self-pay | Admitting: *Deleted

## 2022-06-21 NOTE — Telephone Encounter (Signed)
Patient Kisquali denied.  Dr Marin Olp requested to do a level 2 appeal. phone number (tele: [phone]), case ID [id number], and patient ID: [id number] for MD.  Dr Marin Olp was told it could take 15 days to receive an answer.  Leron Croak [facility] notified of above.

## 2022-06-25 ENCOUNTER — Other Ambulatory Visit (HOSPITAL_COMMUNITY): Payer: Self-pay

## 2022-06-25 ENCOUNTER — Telehealth: Payer: Self-pay

## 2022-06-25 NOTE — Telephone Encounter (Signed)
Oral Oncology Patient Advocate Encounter   Began application for assistance for Kisqali through PANO/ NPA[employer].   Prior Authorization Level 2 Appeal was denied. We are proceeding to see if patient will qualify for Manufacturer Assistance.   Application will be submitted upon completion of necessary supporting documentation.   [employer]' phone number [phone].   I will continue to check the status until final determination.   [name], [place] [facility] Patient [place]  [phone] (phone) [phone] (fax) 06/25/2022 9:22 AM

## 2022-06-25 NOTE — Telephone Encounter (Signed)
Oral Oncology Patient Advocate Encounter   Submitted application for assistance for Kisqali to PANO/ NPAF.   Application submitted via e-fax to [phone]   [employer]' phone number [phone].   I will continue to check the status until final determination.   [name], [place] [facility] Patient [place]  [phone] (phone) [phone] (fax) 06/25/2022 11:45 AM

## 2022-06-29 NOTE — Telephone Encounter (Signed)
Called to check status of application. Informed that HCP Form did not submit correctly and need copy of patient financials. Have refaxed HCP Form and contacting patient for financial documents. I will continue to follow and update.  [name], [facility] Patient [place]  [phone] (phone) [phone] (fax) 06/29/2022 8:24 AM

## 2022-06-29 NOTE — Telephone Encounter (Signed)
Left VM for patient informing them that I needed Proof of Income to submit for Patient Assistance. I will continue to follow and update.   [name], [place] [facility] Patient [place]  [phone] (phone) [phone] (fax) 06/29/2022 10:18 AM

## 2022-07-01 NOTE — Telephone Encounter (Signed)
Sent patient a Therapist, music. Message has been read so patient is aware we are trying to contact him to finish application.   [name], [facility] Patient [employer]  [phone] (phone) [phone] (fax) 07/01/2022 1:35 PM

## 2022-07-08 ENCOUNTER — Encounter: Payer: Self-pay | Admitting: Hematology & Oncology

## 2022-07-08 ENCOUNTER — Other Ambulatory Visit: Payer: Self-pay

## 2022-07-08 ENCOUNTER — Other Ambulatory Visit (HOSPITAL_COMMUNITY): Payer: Self-pay

## 2022-07-08 ENCOUNTER — Inpatient Hospital Stay: Payer: BC Managed Care – PPO

## 2022-07-08 ENCOUNTER — Inpatient Hospital Stay: Payer: BC Managed Care – PPO | Attending: Hematology & Oncology

## 2022-07-08 ENCOUNTER — Inpatient Hospital Stay (HOSPITAL_BASED_OUTPATIENT_CLINIC_OR_DEPARTMENT_OTHER): Payer: BC Managed Care – PPO | Admitting: Hematology & Oncology

## 2022-07-08 ENCOUNTER — Telehealth: Payer: Self-pay | Admitting: Pharmacist

## 2022-07-08 ENCOUNTER — Encounter: Payer: Self-pay | Admitting: *Deleted

## 2022-07-08 ENCOUNTER — Telehealth: Payer: Self-pay

## 2022-07-08 VITALS — BP 125/81 | HR 75 | Temp 98.6°F | Resp 18 | Ht 68.0 in | Wt 328.0 lb

## 2022-07-08 DIAGNOSIS — C50921 Malignant neoplasm of unspecified site of right male breast: Secondary | ICD-10-CM

## 2022-07-08 DIAGNOSIS — Z79818 Long term (current) use of other agents affecting estrogen receptors and estrogen levels: Secondary | ICD-10-CM | POA: Insufficient documentation

## 2022-07-08 DIAGNOSIS — Z79811 Long term (current) use of aromatase inhibitors: Secondary | ICD-10-CM | POA: Diagnosis not present

## 2022-07-08 DIAGNOSIS — C773 Secondary and unspecified malignant neoplasm of axilla and upper limb lymph nodes: Secondary | ICD-10-CM | POA: Diagnosis not present

## 2022-07-08 DIAGNOSIS — Z79899 Other long term (current) drug therapy: Secondary | ICD-10-CM | POA: Insufficient documentation

## 2022-07-08 DIAGNOSIS — Z9221 Personal history of antineoplastic chemotherapy: Secondary | ICD-10-CM | POA: Insufficient documentation

## 2022-07-08 DIAGNOSIS — Z170 Estrogen receptor positive status [ER+]: Secondary | ICD-10-CM | POA: Diagnosis not present

## 2022-07-08 DIAGNOSIS — Z923 Personal history of irradiation: Secondary | ICD-10-CM | POA: Diagnosis not present

## 2022-07-08 DIAGNOSIS — C50021 Malignant neoplasm of nipple and areola, right male breast: Secondary | ICD-10-CM | POA: Diagnosis not present

## 2022-07-08 LAB — CMP (CANCER CENTER ONLY)
ALT: 35 U/L (ref 0–44)
AST: 21 U/L (ref 15–41)
Albumin: 4.3 g/dL (ref 3.5–5.0)
Alkaline Phosphatase: 45 U/L (ref 38–126)
Anion gap: 8 (ref 5–15)
BUN: 14 mg/dL (ref 6–20)
CO2: 27 mmol/L (ref 22–32)
Calcium: 9.6 mg/dL (ref 8.9–10.3)
Chloride: 102 mmol/L (ref 98–111)
Creatinine: 0.86 mg/dL (ref 0.61–1.24)
GFR, Estimated: >60 mL/min (ref >60)
Glucose, Bld: 124 mg/dL — ABNORMAL HIGH (ref 70–99)
Potassium: 3.9 mmol/L (ref 3.5–5.1)
Sodium: 137 mmol/L (ref 135–145)
Total Bilirubin: 0.7 mg/dL (ref 0.3–1.2)
Total Protein: 7 g/dL (ref 6.5–8.1)

## 2022-07-08 LAB — CBC WITH DIFFERENTIAL (CANCER CENTER ONLY)
Abs Immature Granulocytes: 0.05 10*3/uL (ref 0.00–0.07)
Basophils Absolute: 0 10*3/uL (ref 0.0–0.1)
Basophils Relative: 0 %
Eosinophils Absolute: 0.1 10*3/uL (ref 0.0–0.5)
Eosinophils Relative: 2 %
HCT: 48.1 % (ref 39.0–52.0)
Hemoglobin: 15.8 g/dL (ref 13.0–17.0)
Immature Granulocytes: 1 %
Lymphocytes Relative: 18 %
Lymphs Abs: 1.1 10*3/uL (ref 0.7–4.0)
MCH: 26.6 pg (ref 26.0–34.0)
MCHC: 32.8 g/dL (ref 30.0–36.0)
MCV: 81.1 fL (ref 80.0–100.0)
Monocytes Absolute: 0.8 10*3/uL (ref 0.1–1.0)
Monocytes Relative: 14 %
Neutro Abs: 3.9 10*3/uL (ref 1.7–7.7)
Neutrophils Relative %: 65 %
Platelet Count: 271 10*3/uL (ref 150–400)
RBC: 5.93 MIL/uL — ABNORMAL HIGH (ref 4.22–5.81)
RDW: 15.4 % (ref 11.5–15.5)
WBC Count: 6 10*3/uL (ref 4.0–10.5)
nRBC: 0 % (ref 0.0–0.2)

## 2022-07-08 MED ORDER — ABEMACICLIB 150 MG PO TABS: 150.0000 mg | ORAL_TABLET | Freq: Two times a day (BID) | ORAL | 12 refills | Status: DC

## 2022-07-08 MED ORDER — ABEMACICLIB 150 MG PO TABS
150.0000 mg | ORAL_TABLET | Freq: Two times a day (BID) | ORAL | 12 refills | Status: DC
  Filled 2022-07-08: qty 56, 28d supply, fill #0

## 2022-07-08 MED ORDER — SODIUM CHLORIDE 0.9% FLUSH
10.0000 mL | Freq: Once | INTRAVENOUS | Status: AC
  Administered 2022-07-08: 10 mL

## 2022-07-08 MED ORDER — HEPARIN SOD (PORK) LOCK FLUSH 100 UNIT/ML IV SOLN
500.0000 [IU] | Freq: Once | INTRAVENOUS | Status: AC
  Administered 2022-07-08: 500 [IU] via INTRAVENOUS

## 2022-07-08 MED ORDER — ABEMACICLIB 150 MG PO TABS
150.0000 mg | ORAL_TABLET | Freq: Two times a day (BID) | ORAL | 12 refills | Status: DC
  Filled 2022-07-08: qty 70, 35d supply, fill #0

## 2022-07-08 NOTE — Addendum Note (Signed)
Addended by: Burney Gauze R on: 07/08/2022 12:20 PM   Modules accepted: Orders

## 2022-07-08 NOTE — Telephone Encounter (Signed)
Oral Oncology Patient Advocate Encounter  Prior Authorization for Melynda Keller has been approved.    PA# [id number]  Effective dates: 06/08/22 through 07/08/23  Patient must fill through [place].    [name], [place] [facility] Patient [place]  [phone] (phone) [phone] (fax) 07/08/2022 12:25 PM

## 2022-07-08 NOTE — Progress Notes (Signed)
Patient with no navigational needs at this time. Will discontinue active navigation at this time, but be available to the patient as needed in the future.   Oncology Nurse Navigator Documentation     07/08/2022    9:00 AM  Oncology Nurse Navigator Flowsheets  Navigation Complete Date: 07/08/2022  Post Navigation: Continue to Follow Patient? No  Reason Not Navigating Patient: Patient On Maintenance Chemotherapy  Navigator Location [facility]  Navigator Encounter Type Appt/Treatment Plan Review  Patient Visit Type MedOnc  Treatment Phase Active Tx  Barriers/Navigation Needs No Barriers At This Time  Interventions None Required  Acuity Level 1-No Barriers  Support Groups/Services Friends and Family  Time Spent with Patient 15

## 2022-07-08 NOTE — Progress Notes (Addendum)
Hematology and Oncology Follow Up Visit  Noah Taylor [id number] Feb 05, 1966 56 y.o. 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  Unfortunately, his shortness company would not allow [place] to utilize ribociclib.  This is unfortunate.  However, I think that we can certainly utilize [employer].  Again we can utilize this because of the results of the monarchE trial.  This showed that the addition of Abemaciclib to endocrine therapy clearly improves survival.  We will try to use Abemaciclib.  The dose would be 150 mg p.o. twice daily for 2 years.  Has had no problems with the Femara.  There is no arthralgias or myalgias.  He has had no issues with hot flashes.  He and his family are going to fly down to [place] for Christmas.  I am sure they will have a wonderful time down in the [facility].  He has had no problems working.  There is no fatigue or weakness.  He has had no cough or shortness of breath.  He has had no change in bowel or bladder habits.  He has had no rashes.  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  This seems to be healing up.  He has had no fever.  There has been no bleeding.  Overall, I would say his performance status is probably ECOG 0.    Medications:  Allergies as of 07/08/2022   No Known Allergies      Medication List        Accurate as of July 08, 2022  8:43 AM. If you have any questions, ask your nurse or doctor.          Farxiga 10 MG Tabs  tablet Generic drug: dapagliflozin propanediol TAKE 1 TABLET DAILY   fenofibrate 48 MG tablet Commonly known as: TRICOR TAKE 1 TABLET DAILY   letrozole 2.5 MG tablet Commonly known as: FEMARA Take 1 tablet (2.5 mg total) by mouth daily.   losartan 50 MG tablet Commonly known as: COZAAR TAKE 1 TABLET DAILY   metFORMIN 1000 MG tablet Commonly known as: GLUCOPHAGE TAKE 1 TABLET TWICE A DAY WITH MEALS   pravastatin 20 MG tablet Commonly known as: PRAVACHOL TAKE 1 TABLET DAILY   ribociclib succ 200 MG Therapy Pack Commonly known as: KISQALI 437m Daily Dose Take 2 tablets (400 mg total) by mouth daily. Take for 21 days on, 7 days off, repeat every 28 days.   silver sulfADIAZINE 1 % cream Commonly known as: SILVADENE Apply to affected area 2-3 times daily.   triamcinolone cream 0.1 % Commonly known as: KENALOG Apply topically 2 (two) times daily.        Allergies: No Known Allergies  Past Medical History, Surgical history, Social history, and Family History were reviewed and updated.  Review of Systems: Review of Systems  Constitutional: Negative.   HENT: Negative.    Eyes: Negative.   Respiratory: Negative.    Cardiovascular: Negative.   Gastrointestinal: Negative.   Genitourinary: Negative.   Musculoskeletal: Negative.   Skin: Negative.  Neurological: Negative.   Endo/Heme/Allergies: Negative.   Psychiatric/Behavioral: Negative.       Physical Exam:  vitals were not taken for this visit.   Wt Readings from Last 3 Encounters:  06/02/22 (!) 329 lb (149.2 kg)  03/30/22 (!) 321 lb (145.6 kg)  03/01/22 (!) 316 lb (143.3 kg)  Vital signs show temperature of 98.3.  Pulse 93.  Blood pressure 136/92.  Weight is 316 pounds.   Physical Exam Vitals reviewed.  Constitutional:      Comments: Breast exam shows bilateral mastectomies.  He does have little swollen erythema on the right chest wall.  He still has the surgical scar intact.  There is no tenderness.   There is little bit of warmth on the right side.  He has no axillary adenopathy bilaterally.  HENT:     Head: Normocephalic and atraumatic.  Eyes:     Pupils: Pupils are equal, round, and reactive to light.  Cardiovascular:     Rate and Rhythm: Normal rate and regular rhythm.     Heart sounds: Normal heart sounds.  Pulmonary:     Effort: Pulmonary effort is normal.     Breath sounds: Normal breath sounds.  Abdominal:     General: Bowel sounds are normal.     Palpations: Abdomen is soft.  Musculoskeletal:        General: No tenderness or deformity. Normal range of motion.     Cervical back: Normal range of motion.  Lymphadenopathy:     Cervical: No cervical adenopathy.  Skin:    General: Skin is warm and dry.     Findings: No erythema or rash.  Neurological:     Mental Status: He is alert and oriented to person, place, and time.  Psychiatric:        Behavior: Behavior normal.        Thought Content: Thought content normal.        Judgment: Judgment normal.      Lab Results  Component Value Date   WBC 8.5 06/02/2022   HGB 15.8 06/02/2022   HCT 48.2 06/02/2022   MCV 79.0 (L) 06/02/2022   PLT 279 06/02/2022   No results found for: "FERRITIN", "IRON", "TIBC", "UIBC", "IRONPCTSAT" Lab Results  Component Value Date   RBC 6.10 (H) 06/02/2022   No results found for: "KPAFRELGTCHN", "LAMBDASER", "KAPLAMBRATIO" No results found for: "IGGSERUM", "IGA", "IGMSERUM" No results found for: "TOTALPROTELP", "ALBUMINELP", "A1GS", "A2GS", "BETS", "BETA2SER", "GAMS", "MSPIKE", "SPEI"   Chemistry      Component Value Date/Time   NA 136 06/02/2022 0805   K 3.8 06/02/2022 0805   CL 100 06/02/2022 0805   CO2 27 06/02/2022 0805   BUN 17 06/02/2022 0805   CREATININE 0.87 06/02/2022 0805      Component Value Date/Time   CALCIUM 10.0 06/02/2022 0805   ALKPHOS 51 06/02/2022 0805   AST 18 06/02/2022 0805   ALT 28 06/02/2022 0805   BILITOT 0.5 06/02/2022 0805       Impression and  Plan: Noah Taylor is a very pleasant 56 yo caucasian gentleman with locally advanced infiltrating ductal carcinoma of the right breast - retroareolar, ER+/PR+/HER2-.   He did have neoadjuvant therapy.  He did well with this.  He did have a decent response.  Again, he completed radiation therapy.  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  We will now try using Abemaciclib with the Femara.  Again, this is clearly proven to be effective according to the  monarchE trial.  I really hope that the insurance company will approve this.  Again, the dose is going to be 150 mg p.o. twice daily for 2 years.  We will have to do Zoladex.  I will see about trying to get this every 3 months  I would like to try to get him back in 6 weeks.  If all looks good in 6 weeks, then we can try to move his appointments out to every 3 months.  He is really done a great job.  I realize that he had incredibly aggressive neoadjuvant therapy.  He still had 2 positive lymph nodes at the time of mastectomy.  He then underwent radiation therapy.   Volanda Napoleon, MD 12/14/20238:43 AM

## 2022-07-08 NOTE — Patient Instructions (Signed)

## 2022-07-08 NOTE — Telephone Encounter (Addendum)
Oral Oncology Pharmacist Encounter  Received new prescription for Verzenio (abemaciclib) for the treatment of ER/PR positive, HER-2 negative early stage, high risk breast cancer in conjunction with letrozole and Zoladex (OK per Dr. Marin Olp 07/08/22), planned duration 2 years.  CBC w/ Diff and CMP from 07/08/22 assessed, no baseline dose adjustments required. Prescription dose and frequency assessed for appropriateness. Appropriate for therapy initiation.   Current medication list in Epic reviewed, DDIs with Verzenio identified: Category C drug-drug interaction between [employer] and Metformin - Verzenio may increase serum concentrations of metformin - recommend monitoring for increased ADEs from Metformin. No changes in therapy warranted at this time.  Evaluated chart and no patient barriers to medication adherence noted.   Patient's insurance requires Verzenio to be filled through [employer]. Prescription redirected for dispensing.   Oral Oncology Clinic will continue to follow for insurance authorization, copayment issues, initial counseling and start date.  Leron Croak, PharmD, BCPS, [facility] Hematology/Oncology Clinical Pharmacist [facility] and [place] [phone] 07/08/2022 9:37 AM

## 2022-07-08 NOTE — Telephone Encounter (Signed)
Oral Oncology Patient Advocate Encounter   Received notification that prior authorization for Verzenio is required.   PA submitted on 12.14.23  Key [id number]  Status is pending     [name], [place] Patient [place]  [phone] (phone) [phone] (fax) 07/08/2022 9:57 AM

## 2022-07-09 ENCOUNTER — Telehealth: Payer: Self-pay | Admitting: *Deleted

## 2022-07-09 NOTE — Telephone Encounter (Signed)
Per scheduling message [place] patient and he said that he sent Dr. Marin Olp a MyChart message for clarification on getting the injection. Until he receives clarifation, he is refusing the injection.

## 2022-07-10 LAB — TESTOSTERONE: Testosterone: 911 ng/dL (ref 264–916)

## 2022-07-14 NOTE — Telephone Encounter (Signed)
Oral Oncology Pharmacist Encounter   Noted patient sent mychart message stating he is not going to proceed with Verzenio at this time. Oral Oncology Clinic will stop following at this time.   Noah Taylor, PharmD, BCPS, [facility] Hematology/Oncology Clinical Pharmacist [facility] and [place] [phone] 07/14/2022 10:04 AM

## 2022-07-15 ENCOUNTER — Other Ambulatory Visit (HOSPITAL_COMMUNITY): Payer: Self-pay

## 2022-07-28 ENCOUNTER — Encounter: Payer: BC Managed Care – PPO | Admitting: Family Medicine

## 2022-08-06 ENCOUNTER — Encounter: Payer: Self-pay | Admitting: Family Medicine

## 2022-08-06 ENCOUNTER — Ambulatory Visit (INDEPENDENT_AMBULATORY_CARE_PROVIDER_SITE_OTHER): Payer: BC Managed Care – PPO | Admitting: Family Medicine

## 2022-08-06 VITALS — BP 122/84 | HR 84 | Temp 98.1°F | Resp 18 | Ht 68.0 in | Wt 327.5 lb

## 2022-08-06 DIAGNOSIS — Z0000 Encounter for general adult medical examination without abnormal findings: Secondary | ICD-10-CM

## 2022-08-06 DIAGNOSIS — E669 Obesity, unspecified: Secondary | ICD-10-CM | POA: Diagnosis not present

## 2022-08-06 DIAGNOSIS — E1169 Type 2 diabetes mellitus with other specified complication: Secondary | ICD-10-CM | POA: Diagnosis not present

## 2022-08-06 DIAGNOSIS — Z125 Encounter for screening for malignant neoplasm of prostate: Secondary | ICD-10-CM

## 2022-08-06 LAB — LIPID PANEL
Cholesterol: 178 mg/dL (ref 0–200)
HDL: 41.1 mg/dL (ref >39.00)
LDL Cholesterol: 103 mg/dL — ABNORMAL HIGH (ref 0–99)
NonHDL: 136.79
Total CHOL/HDL Ratio: 4
Triglycerides: 167 mg/dL — ABNORMAL HIGH (ref 0.0–149.0)
VLDL: 33.4 mg/dL (ref 0.0–40.0)

## 2022-08-06 LAB — PSA: PSA: 1.04 ng/mL (ref 0.10–4.00)

## 2022-08-06 LAB — MICROALBUMIN / CREATININE URINE RATIO
Creatinine,U: 60.4 mg/dL
Microalb Creat Ratio: 1.2 mg/g (ref 0.0–30.0)
Microalb, Ur: <0.7 mg/dL (ref 0.0–1.9)

## 2022-08-06 LAB — CBC
HCT: 49.3 % (ref 39.0–52.0)
Hemoglobin: 16.6 g/dL (ref 13.0–17.0)
MCHC: 33.7 g/dL (ref 30.0–36.0)
MCV: 81.3 fl (ref 78.0–100.0)
Platelets: 291 10*3/uL (ref 150.0–400.0)
RBC: 6.07 Mil/uL — ABNORMAL HIGH (ref 4.22–5.81)
RDW: 14.3 % (ref 11.5–15.5)
WBC: 6.4 10*3/uL (ref 4.0–10.5)

## 2022-08-06 LAB — COMPREHENSIVE METABOLIC PANEL
ALT: 38 U/L (ref 0–53)
AST: 21 U/L (ref 0–37)
Albumin: 4.4 g/dL (ref 3.5–5.2)
Alkaline Phosphatase: 54 U/L (ref 39–117)
BUN: 22 mg/dL (ref 6–23)
CO2: 28 mEq/L (ref 19–32)
Calcium: 9.8 mg/dL (ref 8.4–10.5)
Chloride: 97 mEq/L (ref 96–112)
Creatinine, Ser: 1.13 mg/dL (ref 0.40–1.50)
GFR: 72.16 mL/min (ref >60.00)
Glucose, Bld: 165 mg/dL — ABNORMAL HIGH (ref 70–99)
Potassium: 4 mEq/L (ref 3.5–5.1)
Sodium: 136 mEq/L (ref 135–145)
Total Bilirubin: 0.7 mg/dL (ref 0.2–1.2)
Total Protein: 7 g/dL (ref 6.0–8.3)

## 2022-08-06 LAB — HEMOGLOBIN A1C: Hgb A1c MFr Bld: 6.5 % (ref 4.6–6.5)

## 2022-08-06 NOTE — Patient Instructions (Signed)
Give us 2-3 business days to get the results of your labs back.   Keep the diet clean and stay active.  Please get me a copy of your advanced directive form at your convenience.   Let us know if you need anything.  

## 2022-08-06 NOTE — Progress Notes (Signed)
Chief Complaint  Patient presents with   Annual Exam    Not Fasting    Well Male Noah Taylor is here for a complete physical.   His last physical was >1 year ago.  Current diet: in general, diet could be better.  Current exercise: walking Weight trend: stable Fatigue out of ordinary? No. Seat belt? Yes.   Advanced directive? 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 30 tablet 8   losartan (COZAAR) 50 MG tablet TAKE 1 TABLET DAILY 90 tablet 3   metFORMIN (GLUCOPHAGE) 1000 MG tablet TAKE 1 TABLET TWICE A DAY WITH MEALS 180 tablet 3   pravastatin (PRAVACHOL) 20 MG tablet TAKE 1 TABLET DAILY 90 tablet 3   Allergies No Known Allergies  Family History Family History  Problem Relation Age of Onset   Arthritis Mother    Cancer Mother        ovarian   Hyperlipidemia Father    Hypertension Father    Cancer Father        lung cancer   Colon cancer Neg Hx    Colon polyps Neg Hx    Esophageal cancer Neg Hx    Rectal cancer Neg Hx    Stomach cancer Neg Hx     Review of Systems: Constitutional:  no fevers Eye:  no recent significant change in vision Ear/Nose/Mouth/Throat:  Ears:  no hearing loss Nose/Mouth/Throat:  no complaints of nasal congestion, no sore throat Cardiovascular:  no  chest pain Respiratory:  no shortness of breath Gastrointestinal:  no change in bowel habits GU:  Male: negative for dysuria, frequency Musculoskeletal/Extremities:  no joint pain Integumentary (Skin/Breast):  no abnormal skin lesions reported Neurologic:  no headaches Endocrine: No unexpected weight changes Hematologic/Lymphatic:  no abnormal bleeding  Exam BP 122/84   Pulse 84   Temp 98.1 F (36.7 C) (Oral)   Resp 18   Ht '5\' 8"'$  (1.727 m)   Wt (!) 327 lb 8 oz (148.6 kg)   SpO2 98%   BMI 49.80 kg/m  General:  well developed, well nourished, in no apparent distress Skin:  no significant moles, warts, or growths Head:  no masses, lesions, or tenderness Eyes:  pupils equal and round, sclera anicteric without injection Ears:  canals without lesions, TMs shiny without retraction, no obvious effusion, no erythema Nose:  nares patent, mucosa normal Throat/Pharynx:  lips and gingiva without lesion; tongue and uvula midline; non-inflamed pharynx; no exudates or postnasal drainage Neck: neck supple without adenopathy, thyromegaly, or masses Cardiac: RRR, no bruits, no LE edema Lungs:  clear to auscultation, breath sounds equal bilaterally, no respiratory distress Abdomen: BS+, soft, non-tender, non-distended, no masses or organomegaly noted Rectal: Deferred Musculoskeletal:  symmetrical muscle groups  noted without atrophy or deformity Neuro:  gait normal; deep tendon reflexes normal and symmetric Psych: well oriented with normal range of affect and appropriate judgment/insight  Assessment and Plan  Well adult exam - Plan: CBC, Comprehensive metabolic panel, Lipid panel  Diabetes mellitus type 2 in obese (HCC) - Plan: Hemoglobin A1c, Microalbumin / creatinine urine ratio  Screening for prostate cancer - Plan: PSA   Well 57 y.o. male. Counseled on diet and exercise. Counseled on risks and benefits of prostate cancer screening with PSA. The patient agrees to undergo  testing. Advanced directive form provided today.  Immunizations, labs, and further orders as above. Follow up in 6 mo. The patient voiced understanding and agreement to the plan.  [place], DO 08/06/22 7:04 AM

## 2022-08-16 ENCOUNTER — Encounter: Payer: Self-pay | Admitting: Hematology & Oncology

## 2022-08-16 ENCOUNTER — Other Ambulatory Visit: Payer: Self-pay

## 2022-08-16 DIAGNOSIS — C50921 Malignant neoplasm of unspecified site of right male breast: Secondary | ICD-10-CM

## 2022-08-16 MED ORDER — LETROZOLE 2.5 MG PO TABS: 2.5000 mg | ORAL_TABLET | Freq: Every day | ORAL | 4 refills | Status: DC

## 2022-08-17 ENCOUNTER — Encounter: Payer: Self-pay | Admitting: Family Medicine

## 2022-08-18 ENCOUNTER — Ambulatory Visit: Payer: BC Managed Care – PPO | Admitting: Family Medicine

## 2022-08-20 ENCOUNTER — Ambulatory Visit: Payer: BC Managed Care – PPO | Admitting: Family Medicine

## 2022-08-20 ENCOUNTER — Encounter: Payer: Self-pay | Admitting: Family Medicine

## 2022-08-20 VITALS — BP 129/82 | HR 86 | Temp 98.0°F | Ht 68.0 in | Wt 332.1 lb

## 2022-08-20 DIAGNOSIS — H8111 Benign paroxysmal vertigo, right ear: Secondary | ICD-10-CM

## 2022-08-20 NOTE — Progress Notes (Signed)
Chief Complaint  Patient presents with   Dizziness    Noah Taylor is 57 y.o. pt here for dizziness.  Duration: 5 days Pass out? No Spinning? Yes Lasts a few seconds Recent illness/fever? No Headache? No Neurologic signs? No Change in PO intake? No Palpitations? No  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 ([place]) 10/19/2021   Diabetes mellitus without complication ([place])    Hyperlipidemia    Hypertension    Family History  Problem Relation Age of Onset   Arthritis Mother    Cancer Mother        ovarian   Hyperlipidemia Father    Hypertension Father    Cancer Father        lung cancer   Colon cancer Neg Hx    Colon polyps Neg Hx    Esophageal cancer Neg Hx    Rectal cancer Neg Hx    Stomach cancer Neg Hx    BP 129/82 (BP Location: Left Arm, Patient Position: Sitting, Cuff Size: Large)   Pulse 86   Temp 98 F (36.7 C) (Oral)   Ht '5\' 8"'$  (1.727 m)   Wt (!) 332 lb 2 oz (150.7 kg)   SpO2 96%   BMI 50.50 kg/m  General: Awake, alert, appears stated age Eyes: PERRLA, EOMi Ears: Patent, TM's neg b/l Heart: RRR, no murmurs, no carotid bruits Lungs: CTAB, no accessory muscle use MSK: 5/5 strength throughout, gait normal Neuro: No cerebellar signs, patellar reflex 1/4 b/l wo clonus, calcaneal reflex 0/4 b/l wo clonus, biceps reflex 1/4 b/l wo clonus; Dix-Hall-Pike positive on R. Psych: Age appropriate judgment and insight, normal mood and affect  Benign paroxysmal positional vertigo of right ear  Epley Maneuver, vest rehab if no better. Stay hydrated.  F/u prn. Pt voiced understanding and agreement to the plan.  [place], DO 08/20/22 9:31 AM

## 2022-08-20 NOTE — Patient Instructions (Signed)
Send me a message in a couple days if no better.  Stay hydrated.  Let us know if you need anything.

## 2022-09-14 ENCOUNTER — Encounter: Payer: Self-pay | Admitting: Hematology & Oncology

## 2022-10-18 ENCOUNTER — Inpatient Hospital Stay: Payer: BC Managed Care – PPO | Attending: Hematology & Oncology

## 2022-10-18 ENCOUNTER — Inpatient Hospital Stay: Payer: BC Managed Care – PPO

## 2022-10-18 ENCOUNTER — Inpatient Hospital Stay: Payer: BC Managed Care – PPO | Admitting: Hematology & Oncology

## 2022-10-18 ENCOUNTER — Encounter: Payer: Self-pay | Admitting: Hematology & Oncology

## 2022-10-18 ENCOUNTER — Other Ambulatory Visit: Payer: Self-pay

## 2022-10-18 VITALS — BP 148/86 | HR 75 | Temp 98.2°F | Resp 18 | Ht 68.0 in | Wt 335.0 lb

## 2022-10-18 DIAGNOSIS — C50021 Malignant neoplasm of nipple and areola, right male breast: Secondary | ICD-10-CM | POA: Diagnosis not present

## 2022-10-18 DIAGNOSIS — Z9221 Personal history of antineoplastic chemotherapy: Secondary | ICD-10-CM | POA: Diagnosis not present

## 2022-10-18 DIAGNOSIS — C773 Secondary and unspecified malignant neoplasm of axilla and upper limb lymph nodes: Secondary | ICD-10-CM

## 2022-10-18 DIAGNOSIS — Z923 Personal history of irradiation: Secondary | ICD-10-CM | POA: Insufficient documentation

## 2022-10-18 DIAGNOSIS — C50921 Malignant neoplasm of unspecified site of right male breast: Secondary | ICD-10-CM | POA: Diagnosis not present

## 2022-10-18 LAB — CBC WITH DIFFERENTIAL (CANCER CENTER ONLY)
Abs Immature Granulocytes: 0.03 10*3/uL (ref 0.00–0.07)
Basophils Absolute: 0 10*3/uL (ref 0.0–0.1)
Basophils Relative: 0 %
Eosinophils Absolute: 0.2 10*3/uL (ref 0.0–0.5)
Eosinophils Relative: 2 %
HCT: 55.3 % — ABNORMAL HIGH (ref 39.0–52.0)
Hemoglobin: 17.8 g/dL — ABNORMAL HIGH (ref 13.0–17.0)
Immature Granulocytes: 0 %
Lymphocytes Relative: 21 %
Lymphs Abs: 1.5 10*3/uL (ref 0.7–4.0)
MCH: 25.9 pg — ABNORMAL LOW (ref 26.0–34.0)
MCHC: 32.2 g/dL (ref 30.0–36.0)
MCV: 80.5 fL (ref 80.0–100.0)
Monocytes Absolute: 0.9 10*3/uL (ref 0.1–1.0)
Monocytes Relative: 13 %
Neutro Abs: 4.4 10*3/uL (ref 1.7–7.7)
Neutrophils Relative %: 64 %
Platelet Count: 278 10*3/uL (ref 150–400)
RBC: 6.87 MIL/uL — ABNORMAL HIGH (ref 4.22–5.81)
RDW: 15.8 % — ABNORMAL HIGH (ref 11.5–15.5)
WBC Count: 7.1 10*3/uL (ref 4.0–10.5)
nRBC: 0 % (ref 0.0–0.2)

## 2022-10-18 LAB — CMP (CANCER CENTER ONLY)
ALT: 56 U/L — ABNORMAL HIGH (ref 0–44)
AST: 29 U/L (ref 15–41)
Albumin: 4.6 g/dL (ref 3.5–5.0)
Alkaline Phosphatase: 61 U/L (ref 38–126)
Anion gap: 7 (ref 5–15)
BUN: 13 mg/dL (ref 6–20)
CO2: 31 mmol/L (ref 22–32)
Calcium: 10.1 mg/dL (ref 8.9–10.3)
Chloride: 102 mmol/L (ref 98–111)
Creatinine: 1.02 mg/dL (ref 0.61–1.24)
GFR, Estimated: >60 mL/min (ref >60)
Glucose, Bld: 126 mg/dL — ABNORMAL HIGH (ref 70–99)
Potassium: 4.8 mmol/L (ref 3.5–5.1)
Sodium: 140 mmol/L (ref 135–145)
Total Bilirubin: 0.5 mg/dL (ref 0.3–1.2)
Total Protein: 7.4 g/dL (ref 6.5–8.1)

## 2022-10-18 LAB — LACTATE DEHYDROGENASE: LDH: 161 U/L (ref 98–192)

## 2022-10-18 NOTE — Progress Notes (Signed)
Hematology and Oncology Follow Up Visit  Noah Taylor [id number] [birth year] 57 y.o. 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  He is he has been doing pretty well.  He is only on Femara.  I talked him about using a  CDK4/CDK6 inhibitor.  No other this will be a good idea given the fact that he has relatively significant risk with disease for recurrence given that he had 2 positive lymph nodes after neoadjuvant therapy.  However, he did not wish to go down that route because he would also need to be on Zoladex which would deplete his testosterone.  I certainly understand his philosophy and his rationale.  He is working.  Comes in with his wife.  They are both quite busy.  It sounds like he will have a good year.  I think they are going up to [place] in July for a convention that his wife will be part of.  He has had no problems with respect to where he had surgery and radiation.  His skin is healed up pretty nicely.  He has had no change in bowel or bladder habits.  He has had no nausea or vomiting.  He has had no cough or shortness of breath.  He has had no problems with COVID.  He has had no rashes.  He has had no bleeding.  Currently, I would have to say that his performance status is probably ECOG 1.    Medications:  Allergies as of 10/18/2022   No Known Allergies      Medication List        Accurate as of October 18, 2022 11:09 AM. If you have any questions, ask your nurse or doctor.          STOP taking these medications    prochlorperazine 10 MG tablet Commonly known as:  COMPAZINE Stopped by: Volanda Napoleon, MD       TAKE these medications    Farxiga 10 MG Tabs tablet Generic drug: dapagliflozin propanediol TAKE 1 TABLET DAILY   fenofibrate 48 MG tablet Commonly known as: TRICOR TAKE 1 TABLET DAILY   letrozole 2.5 MG tablet Commonly known as: FEMARA Take 1 tablet (2.5 mg total) by mouth daily.   losartan 50 MG tablet Commonly known as: COZAAR TAKE 1 TABLET DAILY   metFORMIN 1000 MG tablet Commonly known as: GLUCOPHAGE TAKE 1 TABLET TWICE A DAY WITH MEALS   pravastatin 20 MG tablet Commonly known as: PRAVACHOL TAKE 1 TABLET DAILY   silver sulfADIAZINE 1 % cream Commonly known as: SILVADENE Apply 1 Application topically.   triamcinolone cream 0.1 % Commonly known as: KENALOG Apply 1 Application topically.   Woun'Dres Gel        Allergies: No Known Allergies  Past Medical History, Surgical history, Social history, and Family History were reviewed and updated.  Review of Systems: Review of Systems  Constitutional: Negative.   HENT: Negative.    Eyes: Negative.   Respiratory: Negative.    Cardiovascular: Negative.   Gastrointestinal: Negative.   Genitourinary: Negative.   Musculoskeletal: Negative.   Skin: Negative.   Neurological:  Negative.   Endo/Heme/Allergies: Negative.   Psychiatric/Behavioral: Negative.       Physical Exam:  height is 5\' 8"  (1.727 m) and weight is 335 lb (152 kg) (abnormal). His oral temperature is 98.2 F (36.8 C). His blood pressure is 148/86 (abnormal) and his pulse is 75. His respiration is 18 and oxygen saturation is 96%.   Wt Readings from Last 3 Encounters:  10/18/22 (!) 335 lb (152 kg)  08/20/22 (!) 332 lb 2 oz (150.7 kg)  08/06/22 (!) 327 lb 8 oz (148.6 kg)  Vital signs show temperature of 98.3.  Pulse 93.  Blood pressure 136/92.  Weight is 316 pounds.   Physical Exam Vitals reviewed.  Constitutional:      Comments: Breast exam shows bilateral mastectomies.  He does have  little swollen erythema on the right chest wall.  He still has the surgical scar intact.  There is no tenderness.  There is little bit of warmth on the right side.  He has no axillary adenopathy bilaterally.  HENT:     Head: Normocephalic and atraumatic.  Eyes:     Pupils: Pupils are equal, round, and reactive to light.  Cardiovascular:     Rate and Rhythm: Normal rate and regular rhythm.     Heart sounds: Normal heart sounds.  Pulmonary:     Effort: Pulmonary effort is normal.     Breath sounds: Normal breath sounds.  Abdominal:     General: Bowel sounds are normal.     Palpations: Abdomen is soft.  Musculoskeletal:        General: No tenderness or deformity. Normal range of motion.     Cervical back: Normal range of motion.  Lymphadenopathy:     Cervical: No cervical adenopathy.  Skin:    General: Skin is warm and dry.     Findings: No erythema or rash.     Comments: Skin exam shows some hyperpigmentation in the area of his radiation of the right anterior chest wall.  There is no skin breakdown.  Neurological:     Mental Status: He is alert and oriented to person, place, and time.  Psychiatric:        Behavior: Behavior normal.        Thought Content: Thought content normal.        Judgment: Judgment normal.      Lab Results  Component Value Date   WBC 7.1 10/18/2022   HGB 17.8 (H) 10/18/2022   HCT 55.3 (H) 10/18/2022   MCV 80.5 10/18/2022   PLT 278 10/18/2022   No results found for: "FERRITIN", "IRON", "TIBC", "UIBC", "IRONPCTSAT" Lab Results  Component Value Date   RBC 6.87 (H) 10/18/2022   No results found for: "KPAFRELGTCHN", "LAMBDASER", "KAPLAMBRATIO" No results found for: "IGGSERUM", "IGA", "IGMSERUM" No results found for: "TOTALPROTELP", "ALBUMINELP", "A1GS", "A2GS", "BETS", "BETA2SER", "GAMS", "MSPIKE", "SPEI"   Chemistry      Component Value Date/Time   NA 140 10/18/2022 1030   K 4.8 10/18/2022 1030   CL 102 10/18/2022 1030   CO2 31 10/18/2022 1030    BUN 13 10/18/2022 1030   CREATININE 1.02 10/18/2022 1030      Component Value Date/Time   CALCIUM 10.1 10/18/2022 1030   ALKPHOS 61 10/18/2022 1030   AST 29 10/18/2022 1030   ALT 56 (H) 10/18/2022 1030   BILITOT 0.5 10/18/2022 1030       Impression and Plan: Noah Taylor is a very pleasant 57 yo caucasian gentleman with locally advanced infiltrating  ductal carcinoma of the right breast - retroareolar, ER+/PR+/HER2-.   He did have neoadjuvant therapy.  He did well with this.  He did have a decent response.  He still had 2/14 positive lymph nodes.  Again, he completed radiation therapy.  He has some hyperpigmentation where he had the radiation over on the right chest wall.    We will just go ahead and continue him on the Femara.  I talked to he and his wife again about adding Verzenio.  He says he feels confident just with the Femara.  We will go ahead and plan to get him back in 3 months.  I told him if he ever wishes to consider the Verzenio/Zoladex, he can just let us know.   Volanda Napoleon, MD 3/25/202411:09 AM

## 2022-10-18 NOTE — Patient Instructions (Signed)

## 2022-10-18 NOTE — Progress Notes (Signed)
Pt in for port flush with labs today. Patient states his port hasn't been accessed since December but no complaints. Accessed port with 20g needle, flushed with sterile saline with no issues. Unable to get blood return, attempted to use  multiple maneuvers to try and get blood return with no luck. Patient states he gets port a cath taken out in 2 weeks and doesn't want to use cath flo at this time. He states he would like labs through his arm. Flushed with normal saline only and de-accessed patient, taken to lab for peripheral stick.

## 2022-10-24 ENCOUNTER — Other Ambulatory Visit: Payer: Self-pay | Admitting: Family Medicine

## 2022-10-24 DIAGNOSIS — E1169 Type 2 diabetes mellitus with other specified complication: Secondary | ICD-10-CM

## 2022-10-24 DIAGNOSIS — E119 Type 2 diabetes mellitus without complications: Secondary | ICD-10-CM

## 2022-10-24 DIAGNOSIS — E669 Obesity, unspecified: Secondary | ICD-10-CM

## 2022-11-01 DIAGNOSIS — C50121 Malignant neoplasm of central portion of right male breast: Secondary | ICD-10-CM | POA: Diagnosis not present

## 2022-11-01 DIAGNOSIS — Z170 Estrogen receptor positive status [ER+]: Secondary | ICD-10-CM | POA: Diagnosis not present

## 2022-11-13 ENCOUNTER — Other Ambulatory Visit: Payer: Self-pay | Admitting: Family Medicine

## 2022-11-13 DIAGNOSIS — I10 Essential (primary) hypertension: Secondary | ICD-10-CM

## 2022-11-13 DIAGNOSIS — E1169 Type 2 diabetes mellitus with other specified complication: Secondary | ICD-10-CM

## 2022-12-24 DIAGNOSIS — Z170 Estrogen receptor positive status [ER+]: Secondary | ICD-10-CM | POA: Diagnosis not present

## 2022-12-24 DIAGNOSIS — C50121 Malignant neoplasm of central portion of right male breast: Secondary | ICD-10-CM | POA: Diagnosis not present

## 2023-01-08 ENCOUNTER — Other Ambulatory Visit: Payer: Self-pay | Admitting: Family Medicine

## 2023-01-18 ENCOUNTER — Other Ambulatory Visit: Payer: Self-pay

## 2023-01-18 ENCOUNTER — Inpatient Hospital Stay (HOSPITAL_BASED_OUTPATIENT_CLINIC_OR_DEPARTMENT_OTHER): Payer: BC Managed Care – PPO | Admitting: Hematology & Oncology

## 2023-01-18 ENCOUNTER — Inpatient Hospital Stay: Payer: BC Managed Care – PPO | Attending: Hematology & Oncology

## 2023-01-18 ENCOUNTER — Inpatient Hospital Stay: Payer: BC Managed Care – PPO

## 2023-01-18 ENCOUNTER — Encounter: Payer: Self-pay | Admitting: Hematology & Oncology

## 2023-01-18 VITALS — BP 155/93 | HR 87 | Temp 98.7°F | Resp 18 | Ht 68.0 in | Wt 343.0 lb

## 2023-01-18 DIAGNOSIS — C50921 Malignant neoplasm of unspecified site of right male breast: Secondary | ICD-10-CM | POA: Diagnosis not present

## 2023-01-18 DIAGNOSIS — C773 Secondary and unspecified malignant neoplasm of axilla and upper limb lymph nodes: Secondary | ICD-10-CM

## 2023-01-18 DIAGNOSIS — Z170 Estrogen receptor positive status [ER+]: Secondary | ICD-10-CM | POA: Diagnosis not present

## 2023-01-18 DIAGNOSIS — C50021 Malignant neoplasm of nipple and areola, right male breast: Secondary | ICD-10-CM | POA: Insufficient documentation

## 2023-01-18 DIAGNOSIS — Z9221 Personal history of antineoplastic chemotherapy: Secondary | ICD-10-CM | POA: Diagnosis not present

## 2023-01-18 LAB — CBC WITH DIFFERENTIAL (CANCER CENTER ONLY)
Abs Immature Granulocytes: 0.05 10*3/uL (ref 0.00–0.07)
Basophils Absolute: 0 10*3/uL (ref 0.0–0.1)
Basophils Relative: 1 %
Eosinophils Absolute: 0.2 10*3/uL (ref 0.0–0.5)
Eosinophils Relative: 3 %
HCT: 54.7 % — ABNORMAL HIGH (ref 39.0–52.0)
Hemoglobin: 17.8 g/dL — ABNORMAL HIGH (ref 13.0–17.0)
Immature Granulocytes: 1 %
Lymphocytes Relative: 19 %
Lymphs Abs: 1.3 10*3/uL (ref 0.7–4.0)
MCH: 26.6 pg (ref 26.0–34.0)
MCHC: 32.5 g/dL (ref 30.0–36.0)
MCV: 81.6 fL (ref 80.0–100.0)
Monocytes Absolute: 0.7 10*3/uL (ref 0.1–1.0)
Monocytes Relative: 10 %
Neutro Abs: 4.4 10*3/uL (ref 1.7–7.7)
Neutrophils Relative %: 66 %
Platelet Count: 259 10*3/uL (ref 150–400)
RBC: 6.7 MIL/uL — ABNORMAL HIGH (ref 4.22–5.81)
RDW: 15.9 % — ABNORMAL HIGH (ref 11.5–15.5)
WBC Count: 6.6 10*3/uL (ref 4.0–10.5)
nRBC: 0 % (ref 0.0–0.2)

## 2023-01-18 LAB — CMP (CANCER CENTER ONLY)
ALT: 55 U/L — ABNORMAL HIGH (ref 0–44)
AST: 28 U/L (ref 15–41)
Albumin: 4.3 g/dL (ref 3.5–5.0)
Alkaline Phosphatase: 64 U/L (ref 38–126)
Anion gap: 9 (ref 5–15)
BUN: 15 mg/dL (ref 6–20)
CO2: 29 mmol/L (ref 22–32)
Calcium: 10.5 mg/dL — ABNORMAL HIGH (ref 8.9–10.3)
Chloride: 100 mmol/L (ref 98–111)
Creatinine: 1.08 mg/dL (ref 0.61–1.24)
GFR, Estimated: >60 mL/min (ref >60)
Glucose, Bld: 262 mg/dL — ABNORMAL HIGH (ref 70–99)
Potassium: 4.7 mmol/L (ref 3.5–5.1)
Sodium: 138 mmol/L (ref 135–145)
Total Bilirubin: 0.6 mg/dL (ref 0.3–1.2)
Total Protein: 7 g/dL (ref 6.5–8.1)

## 2023-01-18 LAB — LACTATE DEHYDROGENASE: LDH: 141 U/L (ref 98–192)

## 2023-01-18 NOTE — Progress Notes (Signed)
Hematology and Oncology Follow Up Visit  Noah Taylor [id number] 01/31/1966 57 y.o. 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 Noah Taylor is here today for follow-up.  We see him every 3 months.  He has been doing quite well.  He still working.  He is going to have a nice Summer.  He has not had any problems with the Femara.  There is been no hot flashes.  He has had no arthralgias.  Trying to exercise.  He is trying to lose a little bit of weight.  He has had no change in bowel or bladder habits.  He has had no bleeding.  Overall, I would say that his performance status is probably ECOG 0.   Medications:  Allergies as of 01/18/2023   No Known Allergies      Medication List        Accurate as of January 18, 2023  8:12 AM. If you have any questions, ask your nurse or doctor.          dapagliflozin propanediol 10 MG Tabs tablet Commonly known as: Farxiga Take 1 tablet (10 mg total) by mouth daily.   fenofibrate 48 MG tablet Commonly known as: TRICOR TAKE 1 TABLET DAILY   letrozole 2.5 MG tablet Commonly known as: FEMARA Take 1 tablet (2.5 mg total) by mouth daily.   losartan 50 MG tablet Commonly known as: COZAAR TAKE 1 TABLET DAILY   metFORMIN 1000 MG tablet Commonly known as: GLUCOPHAGE TAKE 1 TABLET TWICE A DAY WITH MEALS   pravastatin 20 MG tablet Commonly known as: PRAVACHOL TAKE 1 TABLET DAILY   silver sulfADIAZINE 1 % cream Commonly known as: SILVADENE Apply 1 Application topically.   triamcinolone cream 0.1 % Commonly known as: KENALOG Apply 1 Application topically.   Woun'Dres Gel         Allergies: No Known Allergies  Past Medical History, Surgical history, Social history, and Family History were reviewed and updated.  Review of Systems: Review of Systems  Constitutional: Negative.   HENT: Negative.    Eyes: Negative.   Respiratory: Negative.    Cardiovascular: Negative.   Gastrointestinal: Negative.   Genitourinary: Negative.   Musculoskeletal: Negative.   Skin: Negative.   Neurological: Negative.   Endo/Heme/Allergies: Negative.   Psychiatric/Behavioral: Negative.       Physical Exam:  height is 5\' 8"  (1.727 m) and weight is 343 lb (155.6 kg) (abnormal). His oral temperature is 98.7 F (37.1 C). His blood pressure is 155/93 (abnormal) and his pulse is 87. His respiration is 18 and oxygen saturation is 98%.   Wt Readings from Last 3 Encounters:  01/18/23 (!) 343 lb (155.6 kg)  10/18/22 (!) 335 lb (152 kg)  08/20/22 (!) 332 lb 2 oz (150.7 kg)  Vital signs show temperature of 98.3.  Pulse 93.  Blood pressure 136/92.  Weight is 316 pounds.   Physical Exam Vitals reviewed.  Constitutional:      Comments: Breast exam shows bilateral mastectomies.  He does have some hyperpigmentation on the right chest wall.  He still has the surgical scar intact.  There is no tenderness.  There is little bit of  warmth on the right side.  He has no axillary adenopathy bilaterally.  HENT:     Head: Normocephalic and atraumatic.  Eyes:     Pupils: Pupils are equal, round, and reactive to light.  Cardiovascular:     Rate and Rhythm: Normal rate and regular rhythm.     Heart sounds: Normal heart sounds.  Pulmonary:     Effort: Pulmonary effort is normal.     Breath sounds: Normal breath sounds.  Abdominal:     General: Bowel sounds are normal.     Palpations: Abdomen is soft.  Musculoskeletal:        General: No tenderness or deformity. Normal range of motion.     Cervical back: Normal range of motion.  Lymphadenopathy:     Cervical: No cervical adenopathy.  Skin:     General: Skin is warm and dry.     Findings: No erythema or rash.     Comments: Skin exam shows some hyperpigmentation in the area of his radiation of the right anterior chest wall.  There is no skin breakdown.  Neurological:     Mental Status: He is alert and oriented to person, place, and time.  Psychiatric:        Behavior: Behavior normal.        Thought Content: Thought content normal.        Judgment: Judgment normal.      Lab Results  Component Value Date   WBC 6.6 01/18/2023   HGB 17.8 (H) 01/18/2023   HCT 54.7 (H) 01/18/2023   MCV 81.6 01/18/2023   PLT 259 01/18/2023   No results found for: "FERRITIN", "IRON", "TIBC", "UIBC", "IRONPCTSAT" Lab Results  Component Value Date   RBC 6.70 (H) 01/18/2023   No results found for: "KPAFRELGTCHN", "LAMBDASER", "KAPLAMBRATIO" No results found for: "IGGSERUM", "IGA", "IGMSERUM" No results found for: "TOTALPROTELP", "ALBUMINELP", "A1GS", "A2GS", "BETS", "BETA2SER", "GAMS", "MSPIKE", "SPEI"   Chemistry      Component Value Date/Time   NA 140 10/18/2022 1030   K 4.8 10/18/2022 1030   CL 102 10/18/2022 1030   CO2 31 10/18/2022 1030   BUN 13 10/18/2022 1030   CREATININE 1.02 10/18/2022 1030      Component Value Date/Time   CALCIUM 10.1 10/18/2022 1030   ALKPHOS 61 10/18/2022 1030   AST 29 10/18/2022 1030   ALT 56 (H) 10/18/2022 1030   BILITOT 0.5 10/18/2022 1030       Impression and Plan: Mr. [name] is a very pleasant 57 yo caucasian gentleman with locally advanced infiltrating ductal carcinoma of the right breast - retroareolar, ER+/PR+/HER2-.   He did have neoadjuvant therapy.  He did well with this.  He did have a decent response.  He still had 2/14 positive lymph nodes.  He is on Femara right now.  So far, he is doing pretty well.  He did not wish to have a CDK4/CDK6 inhibitor.  Will hide be cautious with him.  He is still at risk for recurrence.  We will still follow him up in 3 months.    Noah Macho,  MD 6/25/20248:12 AM

## 2023-01-19 LAB — CANCER ANTIGEN 27.29: CA 27.29: 9.8 U/mL (ref 0.0–38.6)

## 2023-01-20 ENCOUNTER — Other Ambulatory Visit: Payer: Self-pay | Admitting: Family Medicine

## 2023-01-20 DIAGNOSIS — E119 Type 2 diabetes mellitus without complications: Secondary | ICD-10-CM

## 2023-02-11 ENCOUNTER — Other Ambulatory Visit: Payer: Self-pay | Admitting: Family Medicine

## 2023-02-22 ENCOUNTER — Encounter: Payer: Self-pay | Admitting: Family Medicine

## 2023-02-22 NOTE — Telephone Encounter (Signed)
Patient is scheduled for a 6 month follow up with Dr. Carmelia Roller on 03/23/2023 at 1:30

## 2023-02-23 ENCOUNTER — Encounter (INDEPENDENT_AMBULATORY_CARE_PROVIDER_SITE_OTHER): Payer: Self-pay

## 2023-03-23 ENCOUNTER — Ambulatory Visit: Payer: BC Managed Care – PPO | Admitting: Family Medicine

## 2023-03-23 ENCOUNTER — Encounter: Payer: Self-pay | Admitting: Family Medicine

## 2023-03-23 VITALS — BP 121/78 | HR 80 | Temp 98.0°F | Ht 68.0 in | Wt 342.5 lb

## 2023-03-23 DIAGNOSIS — Z7984 Long term (current) use of oral hypoglycemic drugs: Secondary | ICD-10-CM | POA: Diagnosis not present

## 2023-03-23 DIAGNOSIS — E669 Obesity, unspecified: Secondary | ICD-10-CM

## 2023-03-23 DIAGNOSIS — E782 Mixed hyperlipidemia: Secondary | ICD-10-CM

## 2023-03-23 DIAGNOSIS — I10 Essential (primary) hypertension: Secondary | ICD-10-CM | POA: Diagnosis not present

## 2023-03-23 DIAGNOSIS — E1169 Type 2 diabetes mellitus with other specified complication: Secondary | ICD-10-CM

## 2023-03-23 LAB — LIPID PANEL
Cholesterol: 181 mg/dL (ref 0–200)
HDL: 40.7 mg/dL (ref >39.00)
NonHDL: 140.32
Total CHOL/HDL Ratio: 4
Triglycerides: 259 mg/dL — ABNORMAL HIGH (ref 0.0–149.0)
VLDL: 51.8 mg/dL — ABNORMAL HIGH (ref 0.0–40.0)

## 2023-03-23 LAB — COMPREHENSIVE METABOLIC PANEL
ALT: 55 U/L — ABNORMAL HIGH (ref 0–53)
AST: 26 U/L (ref 0–37)
Albumin: 4.2 g/dL (ref 3.5–5.2)
Alkaline Phosphatase: 79 U/L (ref 39–117)
BUN: 16 mg/dL (ref 6–23)
CO2: 26 mEq/L (ref 19–32)
Calcium: 9.9 mg/dL (ref 8.4–10.5)
Chloride: 99 mEq/L (ref 96–112)
Creatinine, Ser: 1.06 mg/dL (ref 0.40–1.50)
GFR: 77.57 mL/min (ref >60.00)
Glucose, Bld: 134 mg/dL — ABNORMAL HIGH (ref 70–99)
Potassium: 4.2 mEq/L (ref 3.5–5.1)
Sodium: 137 mEq/L (ref 135–145)
Total Bilirubin: 0.7 mg/dL (ref 0.2–1.2)
Total Protein: 6.9 g/dL (ref 6.0–8.3)

## 2023-03-23 LAB — HEMOGLOBIN A1C: Hgb A1c MFr Bld: 7.4 % — ABNORMAL HIGH (ref 4.6–6.5)

## 2023-03-23 LAB — LDL CHOLESTEROL, DIRECT: Direct LDL: 144 mg/dL

## 2023-03-23 NOTE — Progress Notes (Signed)
Subjective:   Chief Complaint  Patient presents with   Follow-up    6 month    Noah Taylor is a 57 y.o. male here for follow-up of diabetes.   Noah Taylor self monitored glucose range is low 100's.  Patient denies hypoglycemic reactions. He checks his glucose levels sporadically.  Patient does not require insulin.   Medications include: Farxiga 10 mg/d, metformin 1000 mg bid Diet is OK. .  Exercise: some walking  Hypertension Patient presents for hypertension follow up. He does monitor home blood pressures. Blood pressures ranging on average from 120's/80's. He is compliant with medication- losartan 50 mg/d. Patient has these side effects of medication: none Diet/exercise as above.  No Cp or SOB.   Mixed Hyperlipidemia Patient presents for mixed hyperlipidemia follow up. Currently being treated with Tricor 48 mg/d and pravastatin 20 mg/d and compliance with treatment thus far has been good. He denies myalgias. Diet/exercise as above.  The patient is not known to have coexisting coronary artery disease.  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 ([facility]) 10/19/2021   Diabetes mellitus without complication ([facility])    Hyperlipidemia    Hypertension      Related testing: Retinal exam: Done Pneumovax: done  Objective:  BP 121/78 (BP Location: Left Arm, Patient Position: Sitting, Cuff Size: Large)   Pulse 80   Temp 98 F (36.7 C) (Oral)   Ht 5\' 8"  (1.727 m)   Wt (!) 342 lb 8 oz (155.4 kg)   SpO2 94%   BMI 52.08 kg/m  General:  Well developed, well nourished, in no apparent distress Skin: macerated tiss between 4/5th digits on R and 3/4 and 4/5th digits on L. Warm, no pallor or diaphoresis Head:  Normocephalic, atraumatic Eyes:  Pupils equal and round, sclera anicteric without injection  Lungs:  CTAB, no access msc use Cardio:  RRR, no bruits, no LE edema Musculoskeletal:  Symmetrical muscle groups  noted without atrophy or deformity Neuro:  Sensation intact to pinprick on feet Psych: Age appropriate judgment and insight  Assessment:   Type 2 diabetes mellitus with obesity ([facility]) - Plan: Comprehensive metabolic panel, Lipid panel, Hemoglobin A1c  Essential hypertension  Mixed hyperlipidemia   Plan:   Chronic, stable. Cont Metformin 1000 mg bid, Farxiga 10 mg/d. Counseled on diet and exercise. Chronic, stable. Cont losartan 50 mg/d.  Chronic, stable. Cont pravastatin 20 mg/d, Tricor 48 mg/d.  F/u in 6 mo. The patient voiced understanding and agreement to the plan.  Jilda Roche [place], DO 03/23/23 1:39 PM

## 2023-03-23 NOTE — Patient Instructions (Signed)
Give [place] 2-3 business days to get the results of your labs back.   Keep the diet clean and stay active.  Use the daily cream for your feet for 6 weeks. It's between 4/5th digits on the R and 3/4th and 4/5th digits on the L.   I recommend getting the flu shot in mid October. This suggestion would change if the CDC comes out with a different recommendation.   Let us know if you need anything.

## 2023-03-26 IMAGING — MG MM BREAST LOCALIZATION CLIP
6 series · 6 of 18 positions shown · non-contrast
Comparison: Previous exam(s).

CLINICAL DATA: Evaluate biopsy marker

EXAM:
3D DIAGNOSTIC RIGHT MAMMOGRAM POST ULTRASOUND BIOPSY

[R CC synth-2D]
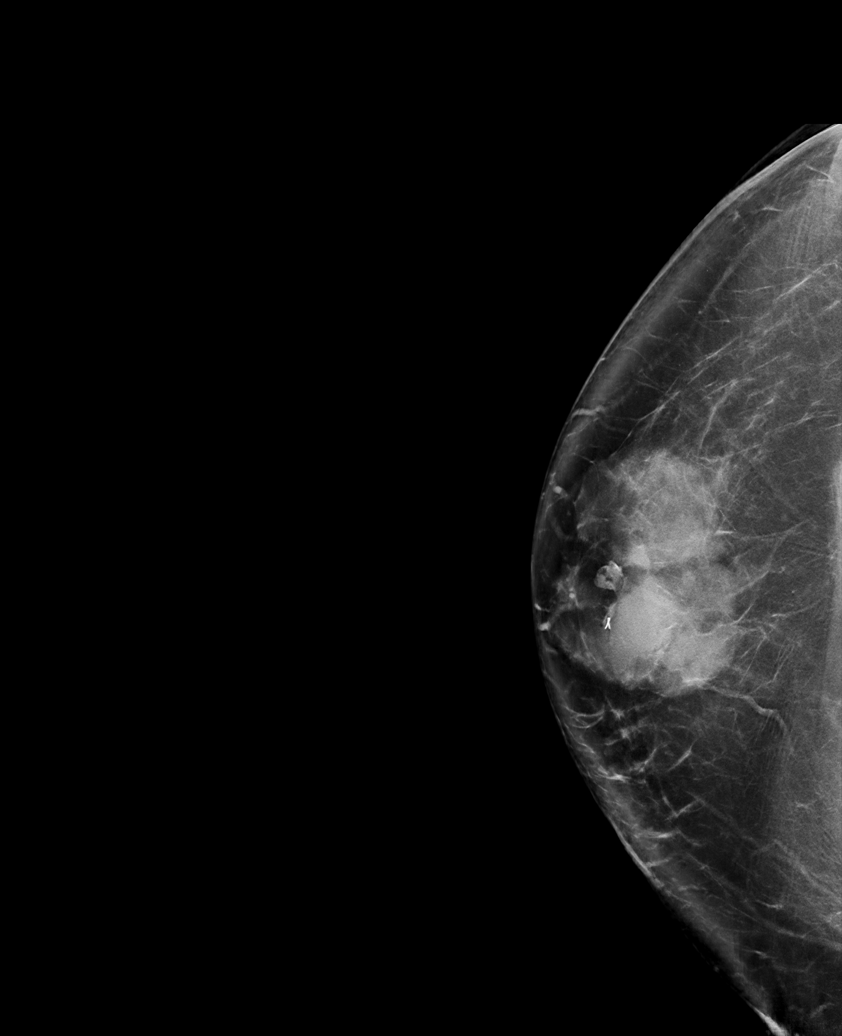

[R XCCL synth-2D]
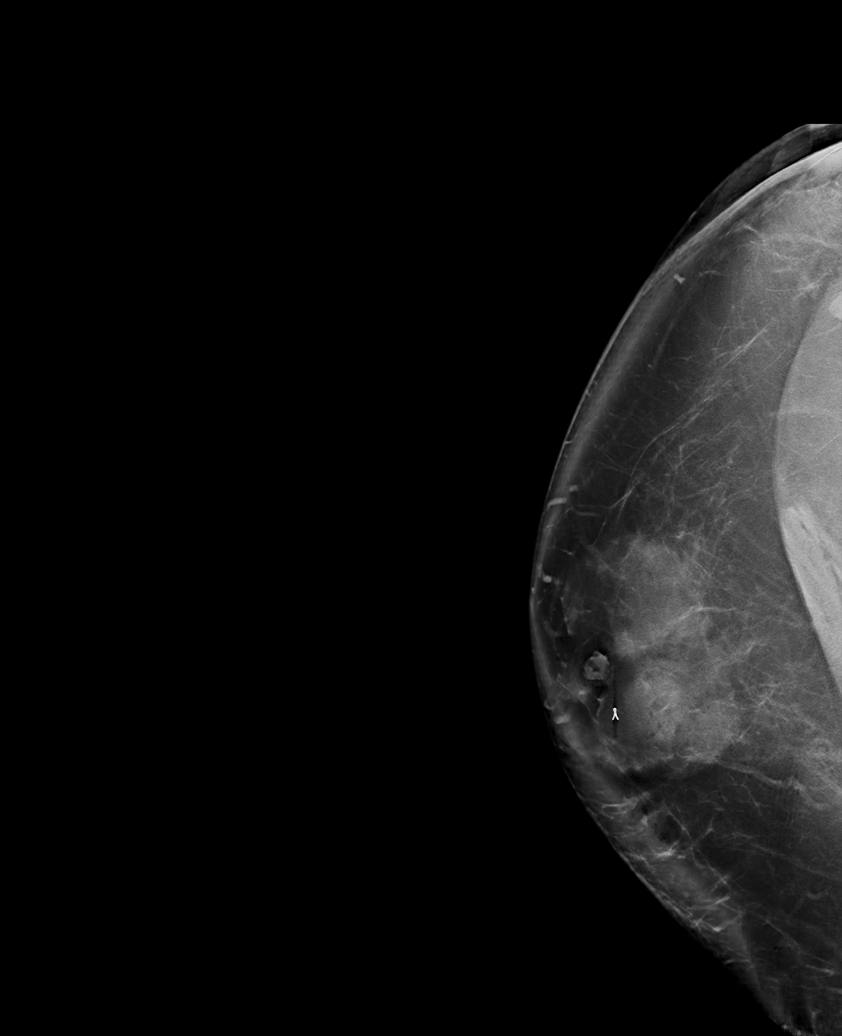

[R ML synth-2D]
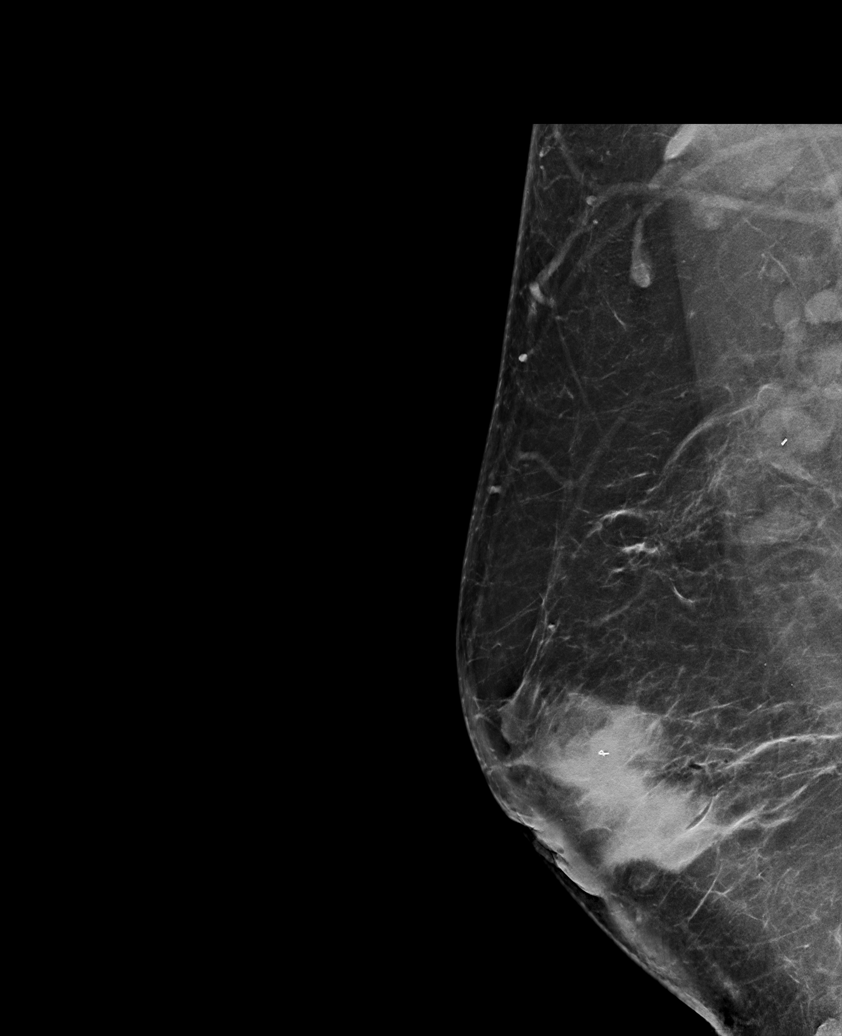

[R ML tomo · tomo slice 55/110.0]
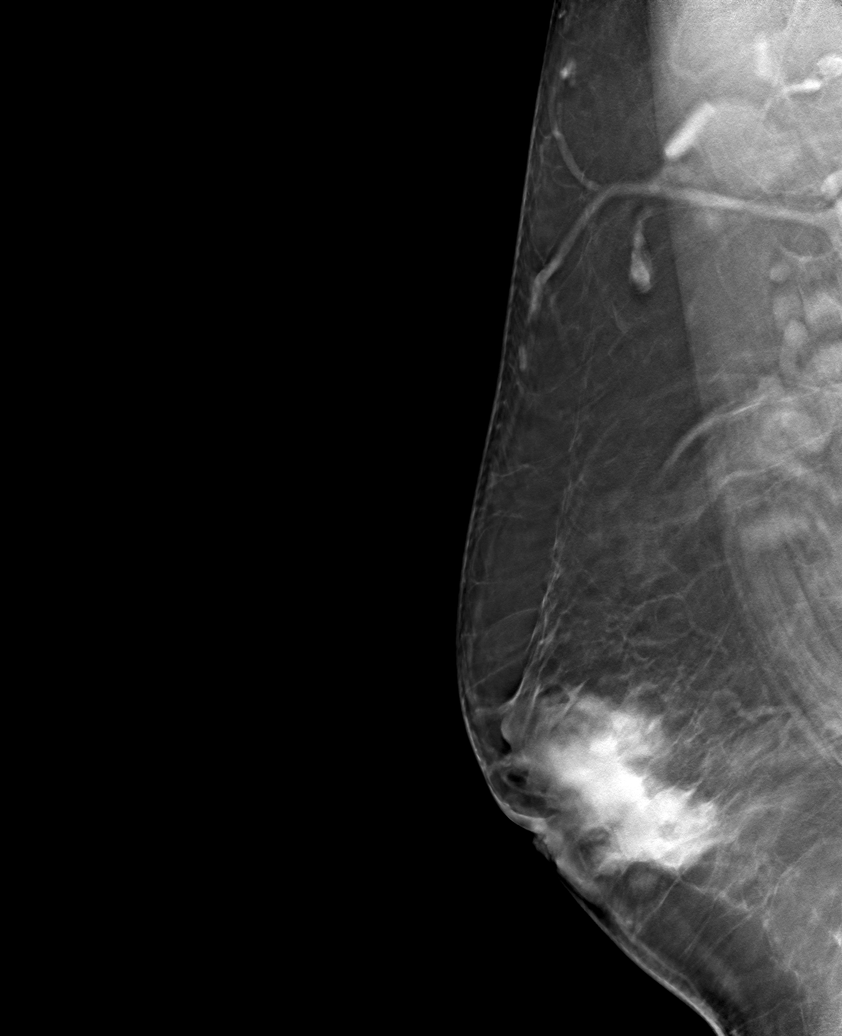

[R CC tomo · tomo slice 57/113.0]
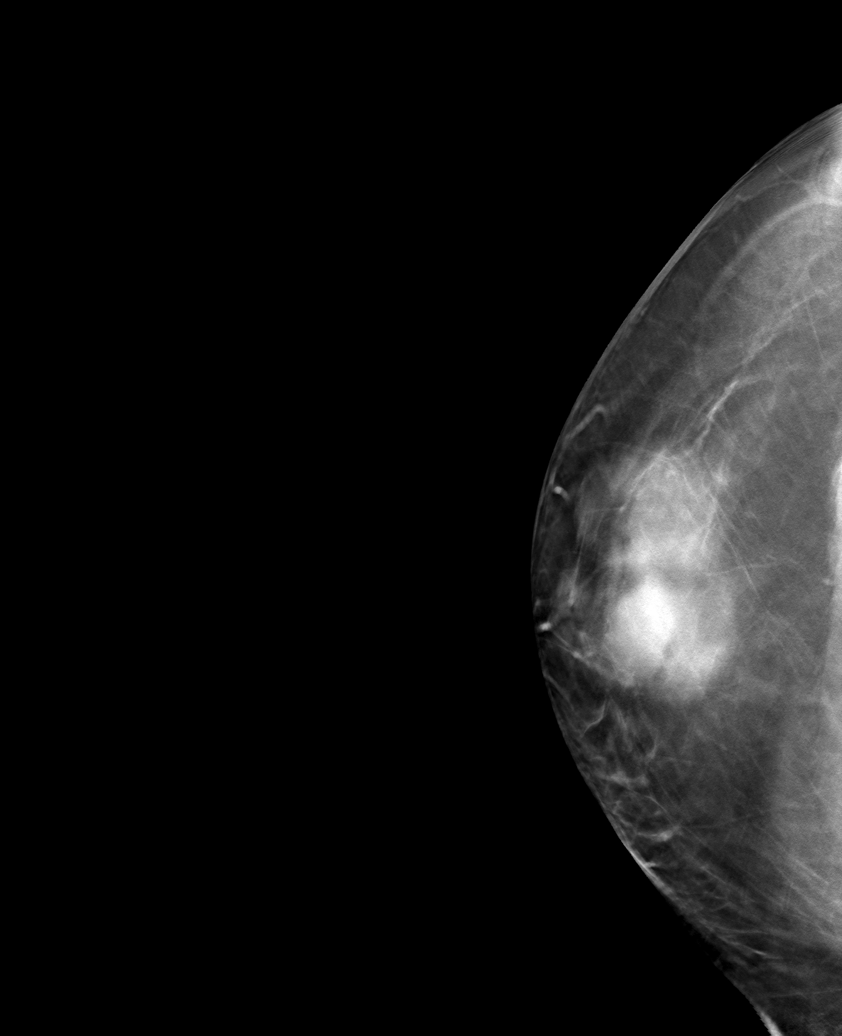

[R XCCL tomo · tomo slice 65/130.0]
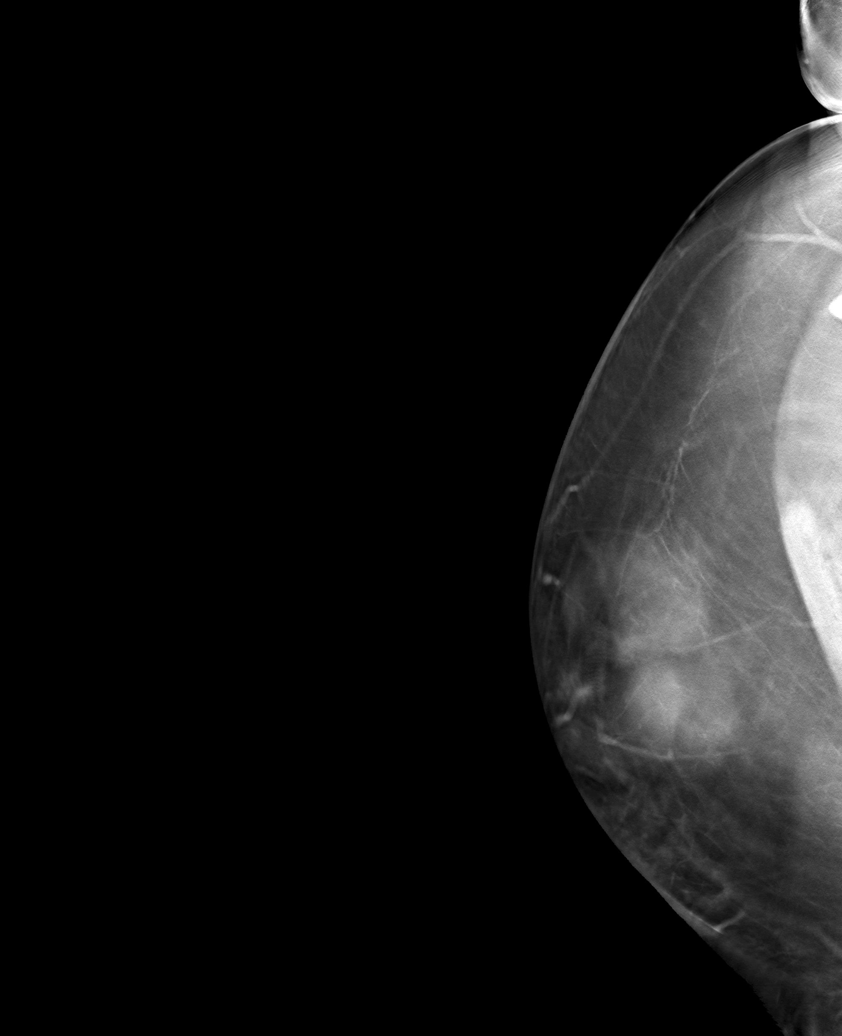

[6 of 18 positions shown; findings below may reference images not displayed]

FINDINGS: 3D Mammographic images were obtained following ultrasound guided
biopsy of a right breast mass and a right axillary lymph node. The
ribbon shaped clip is within the subareolar right breast mass which
was biopsied. The HydroMARK clip is within a low axillary lymph node
on the right.
IMPRESSION: The ribbon shaped clip is within the biopsied right breast mass. The
HydroMARK clip is within the low axillary lymph node that was
biopsied.

Final Assessment: Post Procedure Mammograms for Marker Placement

## 2023-04-04 ENCOUNTER — Encounter: Payer: Self-pay | Admitting: Family Medicine

## 2023-04-04 ENCOUNTER — Other Ambulatory Visit: Payer: Self-pay | Admitting: Family Medicine

## 2023-04-04 DIAGNOSIS — B353 Tinea pedis: Secondary | ICD-10-CM

## 2023-04-04 MED ORDER — KETOCONAZOLE 2 % EX CREA
1.0000 | TOPICAL_CREAM | Freq: Every day | CUTANEOUS | 0 refills | Status: AC
Start: 2023-04-04 — End: 2023-05-16

## 2023-04-12 ENCOUNTER — Encounter: Payer: Self-pay | Admitting: Family Medicine

## 2023-04-20 ENCOUNTER — Inpatient Hospital Stay: Payer: BC Managed Care – PPO | Attending: Hematology & Oncology

## 2023-04-20 ENCOUNTER — Encounter: Payer: Self-pay | Admitting: Hematology & Oncology

## 2023-04-20 ENCOUNTER — Inpatient Hospital Stay (HOSPITAL_BASED_OUTPATIENT_CLINIC_OR_DEPARTMENT_OTHER): Payer: BC Managed Care – PPO | Admitting: Hematology & Oncology

## 2023-04-20 VITALS — BP 145/92 | HR 78 | Temp 98.5°F | Resp 20 | Ht 68.0 in | Wt 337.1 lb

## 2023-04-20 DIAGNOSIS — Z9013 Acquired absence of bilateral breasts and nipples: Secondary | ICD-10-CM | POA: Insufficient documentation

## 2023-04-20 DIAGNOSIS — Z79811 Long term (current) use of aromatase inhibitors: Secondary | ICD-10-CM | POA: Diagnosis not present

## 2023-04-20 DIAGNOSIS — C773 Secondary and unspecified malignant neoplasm of axilla and upper limb lymph nodes: Secondary | ICD-10-CM

## 2023-04-20 DIAGNOSIS — C50021 Malignant neoplasm of nipple and areola, right male breast: Secondary | ICD-10-CM | POA: Insufficient documentation

## 2023-04-20 DIAGNOSIS — C50921 Malignant neoplasm of unspecified site of right male breast: Secondary | ICD-10-CM | POA: Diagnosis not present

## 2023-04-20 DIAGNOSIS — Z170 Estrogen receptor positive status [ER+]: Secondary | ICD-10-CM | POA: Insufficient documentation

## 2023-04-20 LAB — CBC WITH DIFFERENTIAL (CANCER CENTER ONLY)
Abs Immature Granulocytes: 0.03 10*3/uL (ref 0.00–0.07)
Basophils Absolute: 0 10*3/uL (ref 0.0–0.1)
Basophils Relative: 0 %
Eosinophils Absolute: 0.1 10*3/uL (ref 0.0–0.5)
Eosinophils Relative: 2 %
HCT: 56 % — ABNORMAL HIGH (ref 39.0–52.0)
Hemoglobin: 18.6 g/dL — ABNORMAL HIGH (ref 13.0–17.0)
Immature Granulocytes: 0 %
Lymphocytes Relative: 23 %
Lymphs Abs: 1.6 10*3/uL (ref 0.7–4.0)
MCH: 27 pg (ref 26.0–34.0)
MCHC: 33.2 g/dL (ref 30.0–36.0)
MCV: 81.4 fL (ref 80.0–100.0)
Monocytes Absolute: 0.9 10*3/uL (ref 0.1–1.0)
Monocytes Relative: 13 %
Neutro Abs: 4.1 10*3/uL (ref 1.7–7.7)
Neutrophils Relative %: 62 %
Platelet Count: 263 10*3/uL (ref 150–400)
RBC: 6.88 MIL/uL — ABNORMAL HIGH (ref 4.22–5.81)
RDW: 15.6 % — ABNORMAL HIGH (ref 11.5–15.5)
WBC Count: 6.7 10*3/uL (ref 4.0–10.5)
nRBC: 0 % (ref 0.0–0.2)

## 2023-04-20 LAB — CMP (CANCER CENTER ONLY)
ALT: 58 U/L — ABNORMAL HIGH (ref 0–44)
AST: 31 U/L (ref 15–41)
Albumin: 4.4 g/dL (ref 3.5–5.0)
Alkaline Phosphatase: 67 U/L (ref 38–126)
Anion gap: 8 (ref 5–15)
BUN: 13 mg/dL (ref 6–20)
CO2: 28 mmol/L (ref 22–32)
Calcium: 9.5 mg/dL (ref 8.9–10.3)
Chloride: 101 mmol/L (ref 98–111)
Creatinine: 0.98 mg/dL (ref 0.61–1.24)
GFR, Estimated: >60 mL/min (ref >60)
Glucose, Bld: 109 mg/dL — ABNORMAL HIGH (ref 70–99)
Potassium: 4.5 mmol/L (ref 3.5–5.1)
Sodium: 137 mmol/L (ref 135–145)
Total Bilirubin: 0.6 mg/dL (ref 0.3–1.2)
Total Protein: 7.1 g/dL (ref 6.5–8.1)

## 2023-04-20 LAB — LACTATE DEHYDROGENASE: LDH: 141 U/L (ref 98–192)

## 2023-04-20 NOTE — Progress Notes (Signed)
BP remains elevated 145/92, instructed to monitor BP at home and notify PCP if it remains over 140/80. Verbalized understanding.

## 2023-04-20 NOTE — Progress Notes (Signed)
Hematology and Oncology Follow Up Visit  Noah Taylor [id number] 1966-07-03 57 y.o. 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  We see him every 3 months.  He has been doing quite well.  He still working.  He has wife did have a nice Summer.  They actually went up to [place].  She had a conference of there.  He enjoyed going up with her.  He has had no problems with nausea or vomiting.  He has had no problems with cough or shortness of breath.  He has had no headaches.  He has had no arthralgias or myalgias.  There has been no hot flashes or sweats.  He does have these on occasion.  He has had no rashes.  There is been no bleeding.  He has had no change in bowel or bladder habits.  Overall, I would have said that his performance status is ECOG 1.     Medications:  Allergies as of 04/20/2023   No Known Allergies      Medication List        Accurate as of April 20, 2023 10:27 AM. If you have any questions, ask your nurse or doctor.          Farxiga 10 MG Tabs tablet Generic drug: dapagliflozin propanediol TAKE 1 TABLET DAILY   fenofibrate 48 MG tablet Commonly known as: TRICOR TAKE 1 TABLET DAILY   ketoconazole 2 % cream Commonly known as: NIZORAL Apply 1 Application topically daily.   letrozole 2.5 MG tablet Commonly known as: FEMARA Take 1 tablet (2.5 mg total) by mouth daily.   losartan 50 MG tablet Commonly known as: COZAAR TAKE 1 TABLET DAILY   metFORMIN 1000 MG tablet Commonly known as: GLUCOPHAGE TAKE 1 TABLET TWICE A DAY WITH MEALS   pravastatin 20 MG  tablet Commonly known as: PRAVACHOL TAKE 1 TABLET DAILY        Allergies: No Known Allergies  Past Medical History, Surgical history, Social history, and Family History were reviewed and updated.  Review of Systems: Review of Systems  Constitutional: Negative.   HENT: Negative.    Eyes: Negative.   Respiratory: Negative.    Cardiovascular: Negative.   Gastrointestinal: Negative.   Genitourinary: Negative.   Musculoskeletal: Negative.   Skin: Negative.   Neurological: Negative.   Endo/Heme/Allergies: Negative.   Psychiatric/Behavioral: Negative.       Physical Exam:  height is 5\' 8"  (1.727 m) and weight is 337 lb 1.3 oz (152.9 kg) (abnormal). His oral temperature is 98.5 F (36.9 C). His blood pressure is 145/92 (abnormal) and his pulse is 78. His respiration is 20 and oxygen saturation is 97%.   Wt Readings from Last 3 Encounters:  04/20/23 (!) 337 lb 1.3 oz (152.9 kg)  03/23/23 (!) 342 lb 8 oz (155.4 kg)  01/18/23 (!) 343 lb (155.6 kg)  Vital signs show temperature of 98.3.  Pulse 93.  Blood pressure 136/92.  Weight is 316 pounds.   Physical Exam Vitals reviewed.  Constitutional:      Comments: Breast exam shows bilateral mastectomies.  He does have some hyperpigmentation on  the right chest wall.  He still has the surgical scar intact.  There is no tenderness.  There is little bit of warmth on the right side.  He has no axillary adenopathy bilaterally.  HENT:     Head: Normocephalic and atraumatic.  Eyes:     Pupils: Pupils are equal, round, and reactive to light.  Cardiovascular:     Rate and Rhythm: Normal rate and regular rhythm.     Heart sounds: Normal heart sounds.  Pulmonary:     Effort: Pulmonary effort is normal.     Breath sounds: Normal breath sounds.  Abdominal:     General: Bowel sounds are normal.     Palpations: Abdomen is soft.  Musculoskeletal:        General: No tenderness or deformity. Normal range of motion.     Cervical back: Normal  range of motion.  Lymphadenopathy:     Cervical: No cervical adenopathy.  Skin:    General: Skin is warm and dry.     Findings: No erythema or rash.     Comments: Skin exam shows some hyperpigmentation in the area of his radiation of the right anterior chest wall.  There is no skin breakdown.  Neurological:     Mental Status: He is alert and oriented to person, place, and time.  Psychiatric:        Behavior: Behavior normal.        Thought Content: Thought content normal.        Judgment: Judgment normal.     Lab Results  Component Value Date   WBC 6.7 04/20/2023   HGB 18.6 (H) 04/20/2023   HCT 56.0 (H) 04/20/2023   MCV 81.4 04/20/2023   PLT 263 04/20/2023   No results found for: "FERRITIN", "IRON", "TIBC", "UIBC", "IRONPCTSAT" Lab Results  Component Value Date   RBC 6.88 (H) 04/20/2023   No results found for: "KPAFRELGTCHN", "LAMBDASER", "KAPLAMBRATIO" No results found for: "IGGSERUM", "IGA", "IGMSERUM" No results found for: "TOTALPROTELP", "ALBUMINELP", "A1GS", "A2GS", "BETS", "BETA2SER", "GAMS", "MSPIKE", "SPEI"   Chemistry      Component Value Date/Time   NA 137 04/20/2023 0912   K 4.5 04/20/2023 0912   CL 101 04/20/2023 0912   CO2 28 04/20/2023 0912   BUN 13 04/20/2023 0912   CREATININE 0.98 04/20/2023 0912      Component Value Date/Time   CALCIUM 9.5 04/20/2023 0912   ALKPHOS 67 04/20/2023 0912   AST 31 04/20/2023 0912   ALT 58 (H) 04/20/2023 0912   BILITOT 0.6 04/20/2023 0912       Impression and Plan: Mr. Marsolek is a very pleasant 57 yo caucasian gentleman with locally advanced infiltrating ductal carcinoma of the right breast - retroareolar, ER+/PR+/HER2-.   He did have neoadjuvant therapy.  He did well with this.  He did have a decent response.  He still had 2/14 positive lymph nodes.  He is on Femara right now.  So far, he is doing pretty well.  He did not wish to have a CDK4/CDK6 inhibitor.  Will hide be cautious with him.  He is still at risk for  recurrence.  We will still follow him up in 3 months.    Josph Macho, MD 9/25/202410:27 AM

## 2023-04-21 LAB — CANCER ANTIGEN 27.29: CA 27.29: <9 U/mL (ref 0.0–38.6)

## 2023-04-25 IMAGING — DX DG CHEST 1V PORT
1 series · 1 of 1 positions shown · non-contrast
Comparison: None.

CLINICAL DATA: Fevers

EXAM:
PORTABLE CHEST 1 VIEW

[chest ap]
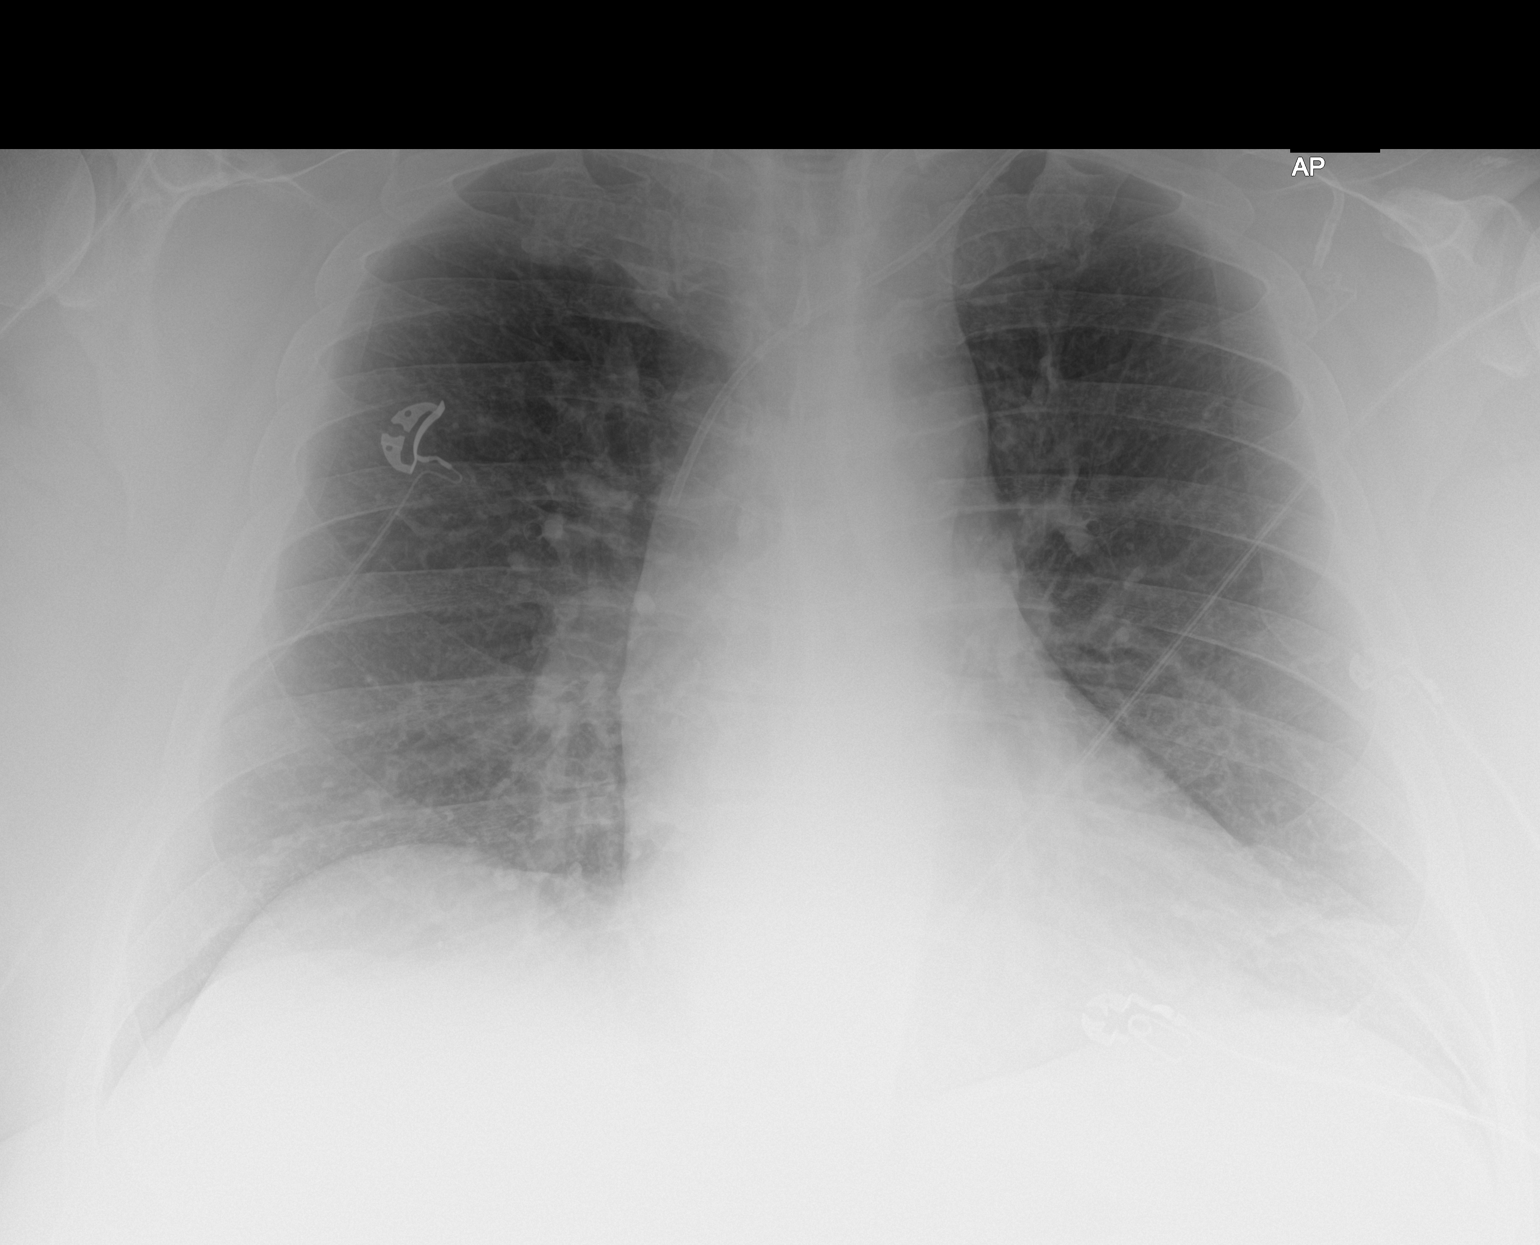

[1 of 1 positions shown; findings below may reference images not displayed]

FINDINGS: Cardiac shadow is within normal limits. Left-sided chest wall port
is noted. The lungs are clear. No bony abnormality is noted.
IMPRESSION: No active disease.

## 2023-06-20 ENCOUNTER — Ambulatory Visit: Payer: BC Managed Care – PPO | Admitting: Family Medicine

## 2023-06-20 ENCOUNTER — Encounter: Payer: Self-pay | Admitting: Family Medicine

## 2023-06-20 VITALS — BP 128/84 | HR 86 | Temp 98.0°F | Resp 16 | Ht 68.0 in | Wt 338.8 lb

## 2023-06-20 DIAGNOSIS — Z6841 Body Mass Index (BMI) 40.0 and over, adult: Secondary | ICD-10-CM

## 2023-06-20 DIAGNOSIS — Z7984 Long term (current) use of oral hypoglycemic drugs: Secondary | ICD-10-CM | POA: Diagnosis not present

## 2023-06-20 DIAGNOSIS — E782 Mixed hyperlipidemia: Secondary | ICD-10-CM | POA: Diagnosis not present

## 2023-06-20 DIAGNOSIS — E669 Obesity, unspecified: Secondary | ICD-10-CM

## 2023-06-20 DIAGNOSIS — E1169 Type 2 diabetes mellitus with other specified complication: Secondary | ICD-10-CM | POA: Diagnosis not present

## 2023-06-20 DIAGNOSIS — M722 Plantar fascial fibromatosis: Secondary | ICD-10-CM | POA: Diagnosis not present

## 2023-06-20 LAB — COMPREHENSIVE METABOLIC PANEL
ALT: 53 U/L (ref 0–53)
AST: 29 U/L (ref 0–37)
Albumin: 4.7 g/dL (ref 3.5–5.2)
Alkaline Phosphatase: 79 U/L (ref 39–117)
BUN: 14 mg/dL (ref 6–23)
CO2: 27 meq/L (ref 19–32)
Calcium: 10.3 mg/dL (ref 8.4–10.5)
Chloride: 101 meq/L (ref 96–112)
Creatinine, Ser: 0.96 mg/dL (ref 0.40–1.50)
GFR: 87.21 mL/min (ref >60.00)
Glucose, Bld: 111 mg/dL — ABNORMAL HIGH (ref 70–99)
Potassium: 4.6 meq/L (ref 3.5–5.1)
Sodium: 138 meq/L (ref 135–145)
Total Bilirubin: 0.8 mg/dL (ref 0.2–1.2)
Total Protein: 7.4 g/dL (ref 6.0–8.3)

## 2023-06-20 LAB — LIPID PANEL
Cholesterol: 191 mg/dL (ref 0–200)
HDL: 45.8 mg/dL (ref >39.00)
LDL Cholesterol: 115 mg/dL — ABNORMAL HIGH (ref 0–99)
NonHDL: 144.84
Total CHOL/HDL Ratio: 4
Triglycerides: 151 mg/dL — ABNORMAL HIGH (ref 0.0–149.0)
VLDL: 30.2 mg/dL (ref 0.0–40.0)

## 2023-06-20 LAB — HEMOGLOBIN A1C: Hgb A1c MFr Bld: 7 % — ABNORMAL HIGH (ref 4.6–6.5)

## 2023-06-20 NOTE — Progress Notes (Signed)
Subjective:   Chief Complaint  Patient presents with   Follow-up    Follow up    Noah Taylor is a 57 y.o. male here for follow-up of diabetes.   Noah Taylor does not routinely check his sugars.  Patient does not require insulin.   Medications include: Metformin 1000 mg bid, Farxiga 10 mg/d Diet is fair.  Exercise: some walking  Hyperlipidemia Patient presents for dyslipidemia follow up. Currently being treated with Tricor 48 mg/d, pravastatin 20 mg/d and compliance with treatment thus far has been good. He denies myalgias. Diet/exercise as above. No CP or SOB.  The patient is not known to have coexisting coronary artery disease.  Heel pain Over the past 2 months, the patient has had pain on the bottom of his left heel.  No specific injury or change activity.  Sharp in nature.  Seems to come and go.  He has not stretch routinely.  He denies any bruising, redness, neurologic signs/symptoms, or swelling.  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 (HCC) 10/19/2021   Diabetes mellitus without complication (HCC)    Hyperlipidemia    Hypertension      Related testing: Retinal exam: Done Pneumovax: done  Objective:  BP 128/84 (BP Location: Left Arm, Patient Position: Sitting, Cuff Size: Normal)   Pulse 86   Temp 98 F (36.7 C) (Oral)   Resp 16   Ht 5\' 8"  (1.727 m)   Wt (!) 338 lb 12.8 oz (153.7 kg)   SpO2 98%   BMI 51.51 kg/m  General:  Well developed, well nourished, in no apparent distress MSK: Mild TTP over the proximal medial insertion of the plantar fascia over the left foot; no edema, erythema, or excessive warmth; high arches Lungs:  CTAB, no access msc use Cardio:  RRR, no bruits, no LE edema Psych: Age appropriate judgment and insight  Assessment:   Type 2 diabetes mellitus with obesity (HCC) - Plan: Comprehensive metabolic panel, Lipid panel, Hemoglobin A1c  Mixed hyperlipidemia  Plantar  fasciitis   Plan:   Chronic, unsure if stable.  Continue on Farxiga 10 mg daily, metformin 1000 mg twice daily.  Could consider weekly injection which we did discuss today.  He would like to see what his lab results are first.  Counseled on diet and exercise. Chronic, hopefully stable.  Continue Tricor 48 mg daily, pravastatin 20 mg daily.  Could consider changing to Crestor or Lipitor. Left foot, arch support recommended.  Strassburg sock recommended.  Stretches and exercises provided.  Ice, Tylenol.  Could consider injection if no improvement versus physical therapy. F/u in 3-6 months pending above. The patient voiced understanding and agreement to the plan.  Jilda Roche [place], DO 06/20/23 8:47 AM

## 2023-06-20 NOTE — Patient Instructions (Addendum)
Give [place] 2-3 business days to get the results of your labs back.   Keep the diet clean and stay active.  Ice/cold pack over area for 10-15 min twice daily.  Consider Powerstep insoles. There are very quality over the counter inserts. Shop around online and in stores. Dr. Margart Sickles is a cheaper alternative, though is not as high of quality.   Consider a Strassburg sock.   Let us know if you need anything.  Plantar Fasciitis Stretches/exercises Do exercises exactly as told by your health care provider and adjust them as directed. It is normal to feel mild stretching, pulling, tightness, or discomfort as you do these exercises, but you should stop right away if you feel sudden pain or your pain gets worse.   Stretching and range of motion exercises These exercises warm up your muscles and joints and improve the movement and flexibility of your foot. These exercises also help to relieve pain.  Exercise A: Plantar fascia stretch Sit with your left / right leg crossed over your opposite knee. Hold your heel with one hand with that thumb near your arch. With your other hand, hold your toes and gently pull them back toward the top of your foot. You should feel a stretch on the bottom of your toes or your foot or both. Hold this stretch for 30 seconds. Slowly release your toes and return to the starting position. Repeat 2 times. Complete this exercise 3 times per week.  Exercise B: Gastroc, standing Stand with your hands against a wall. Extend your left / right leg behind you, and bend your front knee slightly. Keeping your heels on the floor and keeping your back knee straight, shift your weight toward the wall without arching your back. You should feel a gentle stretch in your left / right calf. Hold this position for 30 seconds. Repeat 2 times. Complete this exercise 3 times a week. Exercise C: Soleus, standing Stand with your hands against a wall. Extend your left / right leg behind you,  and bend your front knee slightly. Keeping your heels on the floor, bend your back knee and slightly shift your weight over the back leg. You should feel a gentle stretch deep in your calf. Hold this position for 30 seconds. Repeat 2 times. Complete this exercise 3 times per week. Exercise D: Gastrocsoleus, standing Stand with the ball of your left / right foot on a step. The ball of your foot is on the walking surface, right under your toes. Keep your other foot firmly on the same step. Hold onto the wall or a railing for balance. Slowly lift your other foot, allowing your body weight to press your heel down over the edge of the step. You should feel a stretch in your left / right calf. Hold this position for 30 seconds. Return both feet to the step. Repeat this exercise with a slight bend in your left / right knee. Repeat 2 times with your left / right knee straight and 2times with your left / right knee bent. Complete this exercise 3 times a week.  Balance exercise This exercise builds your balance and strength control of your arch to help take pressure off your plantar fascia. Exercise E: Single leg stand Without shoes, stand near a railing or in a doorway. You may hold onto the railing or door frame as needed. Stand on your left / right foot. Keep your big toe down on the floor and try to keep your arch lifted. Do  not let your foot roll inward. Hold this position for 30 seconds. If this exercise is too easy, you can try it with your eyes closed or while standing on a pillow. Repeat 2 times. Complete this exercise 3 times per week. This information is not intended to replace advice given to you by your health care provider. Make sure you discuss any questions you have with your health care provider. Document Released: 07/12/2005 Document Revised: 03/16/2016 Document Reviewed: 05/26/2015 Elsevier Interactive Patient Education  2017 [employer].

## 2023-06-21 ENCOUNTER — Encounter: Payer: Self-pay | Admitting: Optometry

## 2023-06-21 LAB — HM DIABETES EYE EXAM

## 2023-07-05 DIAGNOSIS — Z170 Estrogen receptor positive status [ER+]: Secondary | ICD-10-CM | POA: Diagnosis not present

## 2023-07-05 DIAGNOSIS — Z923 Personal history of irradiation: Secondary | ICD-10-CM | POA: Diagnosis not present

## 2023-07-05 DIAGNOSIS — C50121 Malignant neoplasm of central portion of right male breast: Secondary | ICD-10-CM | POA: Diagnosis not present

## 2023-07-18 ENCOUNTER — Other Ambulatory Visit: Payer: Self-pay

## 2023-07-18 ENCOUNTER — Inpatient Hospital Stay: Payer: BC Managed Care – PPO | Attending: Hematology & Oncology

## 2023-07-18 ENCOUNTER — Inpatient Hospital Stay: Payer: BC Managed Care – PPO | Admitting: Hematology & Oncology

## 2023-07-18 VITALS — BP 124/84 | HR 86 | Temp 98.4°F | Resp 16 | Ht 68.0 in | Wt 335.0 lb

## 2023-07-18 DIAGNOSIS — Z1732 Human epidermal growth factor receptor 2 negative status: Secondary | ICD-10-CM | POA: Insufficient documentation

## 2023-07-18 DIAGNOSIS — C50021 Malignant neoplasm of nipple and areola, right male breast: Secondary | ICD-10-CM | POA: Insufficient documentation

## 2023-07-18 DIAGNOSIS — Z170 Estrogen receptor positive status [ER+]: Secondary | ICD-10-CM | POA: Diagnosis not present

## 2023-07-18 DIAGNOSIS — E1169 Type 2 diabetes mellitus with other specified complication: Secondary | ICD-10-CM | POA: Diagnosis not present

## 2023-07-18 DIAGNOSIS — Z9013 Acquired absence of bilateral breasts and nipples: Secondary | ICD-10-CM | POA: Insufficient documentation

## 2023-07-18 DIAGNOSIS — C773 Secondary and unspecified malignant neoplasm of axilla and upper limb lymph nodes: Secondary | ICD-10-CM | POA: Insufficient documentation

## 2023-07-18 DIAGNOSIS — Z9221 Personal history of antineoplastic chemotherapy: Secondary | ICD-10-CM | POA: Insufficient documentation

## 2023-07-18 DIAGNOSIS — C50921 Malignant neoplasm of unspecified site of right male breast: Secondary | ICD-10-CM

## 2023-07-18 DIAGNOSIS — Z79811 Long term (current) use of aromatase inhibitors: Secondary | ICD-10-CM | POA: Insufficient documentation

## 2023-07-18 DIAGNOSIS — Z79899 Other long term (current) drug therapy: Secondary | ICD-10-CM | POA: Diagnosis not present

## 2023-07-18 DIAGNOSIS — Z923 Personal history of irradiation: Secondary | ICD-10-CM | POA: Insufficient documentation

## 2023-07-18 DIAGNOSIS — Z1721 Progesterone receptor positive status: Secondary | ICD-10-CM | POA: Insufficient documentation

## 2023-07-18 DIAGNOSIS — E669 Obesity, unspecified: Secondary | ICD-10-CM | POA: Diagnosis not present

## 2023-07-18 LAB — CMP (CANCER CENTER ONLY)
ALT: 53 U/L — ABNORMAL HIGH (ref 0–44)
AST: 26 U/L (ref 15–41)
Albumin: 4.5 g/dL (ref 3.5–5.0)
Alkaline Phosphatase: 76 U/L (ref 38–126)
Anion gap: 8 (ref 5–15)
BUN: 20 mg/dL (ref 6–20)
CO2: 31 mmol/L (ref 22–32)
Calcium: 10.7 mg/dL — ABNORMAL HIGH (ref 8.9–10.3)
Chloride: 100 mmol/L (ref 98–111)
Creatinine: 1.39 mg/dL — ABNORMAL HIGH (ref 0.61–1.24)
GFR, Estimated: 59 mL/min — ABNORMAL LOW (ref >60)
Glucose, Bld: 153 mg/dL — ABNORMAL HIGH (ref 70–99)
Potassium: 4.4 mmol/L (ref 3.5–5.1)
Sodium: 139 mmol/L (ref 135–145)
Total Bilirubin: 0.5 mg/dL (ref <1.2)
Total Protein: 7.4 g/dL (ref 6.5–8.1)

## 2023-07-18 LAB — CBC WITH DIFFERENTIAL (CANCER CENTER ONLY)
Abs Immature Granulocytes: 0.05 10*3/uL (ref 0.00–0.07)
Basophils Absolute: 0.1 10*3/uL (ref 0.0–0.1)
Basophils Relative: 1 %
Eosinophils Absolute: 0.2 10*3/uL (ref 0.0–0.5)
Eosinophils Relative: 2 %
HCT: 55.9 % — ABNORMAL HIGH (ref 39.0–52.0)
Hemoglobin: 18.3 g/dL — ABNORMAL HIGH (ref 13.0–17.0)
Immature Granulocytes: 1 %
Lymphocytes Relative: 23 %
Lymphs Abs: 2 10*3/uL (ref 0.7–4.0)
MCH: 26.7 pg (ref 26.0–34.0)
MCHC: 32.7 g/dL (ref 30.0–36.0)
MCV: 81.6 fL (ref 80.0–100.0)
Monocytes Absolute: 0.9 10*3/uL (ref 0.1–1.0)
Monocytes Relative: 10 %
Neutro Abs: 5.5 10*3/uL (ref 1.7–7.7)
Neutrophils Relative %: 63 %
Platelet Count: 307 10*3/uL (ref 150–400)
RBC: 6.85 MIL/uL — ABNORMAL HIGH (ref 4.22–5.81)
RDW: 15.9 % — ABNORMAL HIGH (ref 11.5–15.5)
WBC Count: 8.6 10*3/uL (ref 4.0–10.5)
nRBC: 0 % (ref 0.0–0.2)

## 2023-07-18 LAB — LACTATE DEHYDROGENASE: LDH: 196 U/L — ABNORMAL HIGH (ref 98–192)

## 2023-07-18 NOTE — Progress Notes (Signed)
Hematology and Oncology Follow Up Visit  Aaban Spielberg [id number] [birth year] 57 y.o. 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 Woodcock is here today for follow-up.  We see him every 3 months.  Since September, he has been doing okay.  He is busy at work.  Had a very nice Thanksgiving.  He is looking forward to Christmas.  He has had no problems with the Femara.  He has had no problems with cough or shortness of breath.  There has been no issues with COVID.  He has had no change in bowel or bladder habits.  He has had no skin lesions.  He has had no leg swelling.  He has had no nausea or vomiting.  He has had no headache.  Overall, I would have to say that his performance status is probably ECOG 0.      Medications:  Allergies as of 07/18/2023   No Known Allergies      Medication List        Accurate as of July 18, 2023  3:07 PM. If you have any questions, ask your nurse or doctor.          Farxiga 10 MG Tabs tablet Generic drug: dapagliflozin propanediol TAKE 1 TABLET DAILY   fenofibrate 48 MG tablet Commonly known as: TRICOR TAKE 1 TABLET DAILY   letrozole 2.5 MG tablet Commonly known as: FEMARA Take 1 tablet (2.5 mg total) by mouth daily.   losartan 50 MG tablet Commonly known as: COZAAR TAKE 1 TABLET DAILY   metFORMIN 1000 MG tablet Commonly known as: GLUCOPHAGE TAKE 1 TABLET TWICE A DAY WITH MEALS   pravastatin 20 MG tablet Commonly known as: PRAVACHOL TAKE 1 TABLET DAILY        Allergies: No Known Allergies  Past Medical History, Surgical history, Social history, and Family History were reviewed  and updated.  Review of Systems: Review of Systems  Constitutional: Negative.   HENT: Negative.    Eyes: Negative.   Respiratory: Negative.    Cardiovascular: Negative.   Gastrointestinal: Negative.   Genitourinary: Negative.   Musculoskeletal: Negative.   Skin: Negative.   Neurological: Negative.   Endo/Heme/Allergies: Negative.   Psychiatric/Behavioral: Negative.       Physical Exam:  height is 5\' 8"  (1.727 m) and weight is 335 lb (152 kg) (abnormal). His oral temperature is 98.4 F (36.9 C). His blood pressure is 124/84 and his pulse is 86. His respiration is 16 and oxygen saturation is 95%.   Vital signs show temperature of 98.4.  Pulse 86.  Blood pressure 124/84.  Weight is 335 pounds.     Physical Exam Vitals reviewed.  Constitutional:      Comments: Breast exam shows bilateral mastectomies.  He does have some hyperpigmentation on the right chest wall.  He still has the surgical scar intact.  There is no tenderness.  There is little bit of warmth on the right side.  He has no axillary adenopathy bilaterally.  HENT:     Head: Normocephalic and atraumatic.  Eyes:     Pupils: Pupils are equal, round, and reactive to light.  Cardiovascular:  Rate and Rhythm: Normal rate and regular rhythm.     Heart sounds: Normal heart sounds.  Pulmonary:     Effort: Pulmonary effort is normal.     Breath sounds: Normal breath sounds.  Abdominal:     General: Bowel sounds are normal.     Palpations: Abdomen is soft.  Musculoskeletal:        General: No tenderness or deformity. Normal range of motion.     Cervical back: Normal range of motion.  Lymphadenopathy:     Cervical: No cervical adenopathy.  Skin:    General: Skin is warm and dry.     Findings: No erythema or rash.     Comments: Skin exam shows some hyperpigmentation in the area of his radiation of the right anterior chest wall.  There is no skin breakdown.  Neurological:     Mental Status: He is alert and oriented to  person, place, and time.  Psychiatric:        Behavior: Behavior normal.        Thought Content: Thought content normal.        Judgment: Judgment normal.     Lab Results  Component Value Date   WBC 8.6 07/18/2023   HGB 18.3 (H) 07/18/2023   HCT 55.9 (H) 07/18/2023   MCV 81.6 07/18/2023   PLT 307 07/18/2023   No results found for: "FERRITIN", "IRON", "TIBC", "UIBC", "IRONPCTSAT" Lab Results  Component Value Date   RBC 6.85 (H) 07/18/2023   No results found for: "KPAFRELGTCHN", "LAMBDASER", "KAPLAMBRATIO" No results found for: "IGGSERUM", "IGA", "IGMSERUM" No results found for: "TOTALPROTELP", "ALBUMINELP", "A1GS", "A2GS", "BETS", "BETA2SER", "GAMS", "MSPIKE", "SPEI"   Chemistry      Component Value Date/Time   NA 139 07/18/2023 1431   K 4.4 07/18/2023 1431   CL 100 07/18/2023 1431   CO2 31 07/18/2023 1431   BUN 20 07/18/2023 1431   CREATININE 1.39 (H) 07/18/2023 1431      Component Value Date/Time   CALCIUM 10.7 (H) 07/18/2023 1431   ALKPHOS 76 07/18/2023 1431   AST 26 07/18/2023 1431   ALT 53 (H) 07/18/2023 1431   BILITOT 0.5 07/18/2023 1431       Impression and Plan: Mr. Thai is a very pleasant 57 yo caucasian gentleman with locally advanced infiltrating ductal carcinoma of the right breast - retroareolar, ER+/PR+/HER2-.   He did have neoadjuvant therapy.  He did well with this.  He did have a decent response.  He still had 2/14 positive lymph nodes.  He is on Femara right now.  So far, he is doing pretty well.  He did not wish to have a CDK4/CDK6 inhibitor.  We will still have to be cautious with him.  He is still at risk for recurrence.  We will still follow him up in 3 months.  We will get him through the Winter and see him back in the Spring.    Josph Macho, MD 12/23/20243:07 PM

## 2023-07-19 LAB — CANCER ANTIGEN 27.29: CA 27.29: 13.3 U/mL (ref 0.0–38.6)

## 2023-08-12 DIAGNOSIS — M1711 Unilateral primary osteoarthritis, right knee: Secondary | ICD-10-CM | POA: Diagnosis not present

## 2023-08-12 DIAGNOSIS — M25561 Pain in right knee: Secondary | ICD-10-CM | POA: Diagnosis not present

## 2023-08-22 DIAGNOSIS — S83241D Other tear of medial meniscus, current injury, right knee, subsequent encounter: Secondary | ICD-10-CM | POA: Diagnosis not present

## 2023-08-22 DIAGNOSIS — M2540 Effusion, unspecified joint: Secondary | ICD-10-CM | POA: Diagnosis not present

## 2023-08-22 DIAGNOSIS — S83241A Other tear of medial meniscus, current injury, right knee, initial encounter: Secondary | ICD-10-CM | POA: Diagnosis not present

## 2023-08-22 DIAGNOSIS — M25461 Effusion, right knee: Secondary | ICD-10-CM | POA: Diagnosis not present

## 2023-08-22 DIAGNOSIS — M25561 Pain in right knee: Secondary | ICD-10-CM | POA: Diagnosis not present

## 2023-08-22 DIAGNOSIS — M2391 Unspecified internal derangement of right knee: Secondary | ICD-10-CM | POA: Diagnosis not present

## 2023-08-29 DIAGNOSIS — M238X1 Other internal derangements of right knee: Secondary | ICD-10-CM | POA: Diagnosis not present

## 2023-08-29 DIAGNOSIS — M25561 Pain in right knee: Secondary | ICD-10-CM | POA: Diagnosis not present

## 2023-08-29 DIAGNOSIS — M7121 Synovial cyst of popliteal space [Baker], right knee: Secondary | ICD-10-CM | POA: Diagnosis not present

## 2023-08-29 DIAGNOSIS — S8991XA Unspecified injury of right lower leg, initial encounter: Secondary | ICD-10-CM | POA: Diagnosis not present

## 2023-09-08 DIAGNOSIS — E119 Type 2 diabetes mellitus without complications: Secondary | ICD-10-CM | POA: Diagnosis not present

## 2023-09-08 DIAGNOSIS — H2513 Age-related nuclear cataract, bilateral: Secondary | ICD-10-CM | POA: Diagnosis not present

## 2023-09-08 DIAGNOSIS — H35033 Hypertensive retinopathy, bilateral: Secondary | ICD-10-CM | POA: Diagnosis not present

## 2023-09-12 DIAGNOSIS — S83241A Other tear of medial meniscus, current injury, right knee, initial encounter: Secondary | ICD-10-CM | POA: Diagnosis not present

## 2023-09-12 DIAGNOSIS — Z9889 Other specified postprocedural states: Secondary | ICD-10-CM | POA: Diagnosis not present

## 2023-10-03 ENCOUNTER — Encounter: Payer: BC Managed Care – PPO | Admitting: Family Medicine

## 2023-10-17 ENCOUNTER — Encounter: Payer: Self-pay | Admitting: Hematology & Oncology

## 2023-10-17 ENCOUNTER — Inpatient Hospital Stay: Payer: BC Managed Care – PPO | Attending: Hematology & Oncology

## 2023-10-17 ENCOUNTER — Other Ambulatory Visit: Payer: Self-pay

## 2023-10-17 ENCOUNTER — Inpatient Hospital Stay (HOSPITAL_BASED_OUTPATIENT_CLINIC_OR_DEPARTMENT_OTHER): Payer: BC Managed Care – PPO | Admitting: Family

## 2023-10-17 VITALS — BP 178/97 | HR 84 | Temp 99.1°F | Resp 18 | Ht 68.0 in | Wt 332.0 lb

## 2023-10-17 DIAGNOSIS — C50921 Malignant neoplasm of unspecified site of right male breast: Secondary | ICD-10-CM

## 2023-10-17 DIAGNOSIS — Z9013 Acquired absence of bilateral breasts and nipples: Secondary | ICD-10-CM | POA: Diagnosis not present

## 2023-10-17 DIAGNOSIS — Z79811 Long term (current) use of aromatase inhibitors: Secondary | ICD-10-CM | POA: Insufficient documentation

## 2023-10-17 DIAGNOSIS — Z1721 Progesterone receptor positive status: Secondary | ICD-10-CM | POA: Insufficient documentation

## 2023-10-17 DIAGNOSIS — Z170 Estrogen receptor positive status [ER+]: Secondary | ICD-10-CM | POA: Insufficient documentation

## 2023-10-17 DIAGNOSIS — C50021 Malignant neoplasm of nipple and areola, right male breast: Secondary | ICD-10-CM | POA: Diagnosis not present

## 2023-10-17 DIAGNOSIS — E1169 Type 2 diabetes mellitus with other specified complication: Secondary | ICD-10-CM

## 2023-10-17 DIAGNOSIS — Z1732 Human epidermal growth factor receptor 2 negative status: Secondary | ICD-10-CM | POA: Insufficient documentation

## 2023-10-17 DIAGNOSIS — C773 Secondary and unspecified malignant neoplasm of axilla and upper limb lymph nodes: Secondary | ICD-10-CM

## 2023-10-17 LAB — CMP (CANCER CENTER ONLY)
ALT: 55 U/L — ABNORMAL HIGH (ref 0–44)
AST: 26 U/L (ref 15–41)
Albumin: 4.4 g/dL (ref 3.5–5.0)
Alkaline Phosphatase: 65 U/L (ref 38–126)
Anion gap: 8 (ref 5–15)
BUN: 18 mg/dL (ref 6–20)
CO2: 29 mmol/L (ref 22–32)
Calcium: 9.9 mg/dL (ref 8.9–10.3)
Chloride: 101 mmol/L (ref 98–111)
Creatinine: 0.94 mg/dL (ref 0.61–1.24)
GFR, Estimated: >60 mL/min (ref >60)
Glucose, Bld: 161 mg/dL — ABNORMAL HIGH (ref 70–99)
Potassium: 4.4 mmol/L (ref 3.5–5.1)
Sodium: 138 mmol/L (ref 135–145)
Total Bilirubin: 0.5 mg/dL (ref 0.0–1.2)
Total Protein: 7 g/dL (ref 6.5–8.1)

## 2023-10-17 LAB — LACTATE DEHYDROGENASE: LDH: 151 U/L (ref 98–192)

## 2023-10-17 LAB — CBC WITH DIFFERENTIAL (CANCER CENTER ONLY)
Abs Immature Granulocytes: 0.06 10*3/uL (ref 0.00–0.07)
Basophils Absolute: 0.1 10*3/uL (ref 0.0–0.1)
Basophils Relative: 1 %
Eosinophils Absolute: 0.2 10*3/uL (ref 0.0–0.5)
Eosinophils Relative: 2 %
HCT: 54.5 % — ABNORMAL HIGH (ref 39.0–52.0)
Hemoglobin: 18 g/dL — ABNORMAL HIGH (ref 13.0–17.0)
Immature Granulocytes: 1 %
Lymphocytes Relative: 24 %
Lymphs Abs: 2.1 10*3/uL (ref 0.7–4.0)
MCH: 27.1 pg (ref 26.0–34.0)
MCHC: 33 g/dL (ref 30.0–36.0)
MCV: 82.2 fL (ref 80.0–100.0)
Monocytes Absolute: 1 10*3/uL (ref 0.1–1.0)
Monocytes Relative: 12 %
Neutro Abs: 5.2 10*3/uL (ref 1.7–7.7)
Neutrophils Relative %: 60 %
Platelet Count: 292 10*3/uL (ref 150–400)
RBC: 6.63 MIL/uL — ABNORMAL HIGH (ref 4.22–5.81)
RDW: 15.5 % (ref 11.5–15.5)
WBC Count: 8.6 10*3/uL (ref 4.0–10.5)
nRBC: 0 % (ref 0.0–0.2)

## 2023-10-17 NOTE — Progress Notes (Signed)
 Hematology and Oncology Follow Up Visit  Cyrus Ramsburg [id number] 12-19-1965 58 y.o. 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 Icenogle is here today for follow-up. He continues to do well and has no complaints at this time.  He is tolerating Femara nicely.  Rare hot flashes. No night sweats.  Bilateral mastectomy intact. No mass, lesion or rash noted.  No adenopathy or lymphedema noted.  No fever, chills, n/v, cough, rash, dizziness, SOB, chest pain, palpitations, abdominal pain or changes in bowel or bladder habits.  No blood loss noted. No bruising or petechiae.  No swelling, tenderness, numbness or tingling in his extremities.  No falls or syncope. Appetite and hydration are good. Weight is stable at 332 lbs.   ECOG Performance Status: 1 - Symptomatic but completely ambulatory  Medications:  Allergies as of 10/17/2023   No Known Allergies      Medication List        Accurate as of October 17, 2023  3:40 PM. If you have any questions, ask your nurse or doctor.          Farxiga 10 MG Tabs tablet Generic drug: dapagliflozin propanediol TAKE 1 TABLET DAILY   fenofibrate 48 MG tablet Commonly known as: TRICOR TAKE 1 TABLET DAILY   letrozole 2.5 MG tablet Commonly known as: FEMARA Take 1 tablet (2.5 mg total) by mouth daily.   losartan 50 MG tablet Commonly known as: COZAAR TAKE 1 TABLET DAILY   metFORMIN 1000 MG tablet Commonly known as: GLUCOPHAGE TAKE 1 TABLET TWICE A DAY WITH MEALS   pravastatin 20 MG tablet Commonly known as: PRAVACHOL TAKE 1 TABLET DAILY        Allergies: No Known Allergies  Past Medical  History, Surgical history, Social history, and Family History were reviewed and updated.  Review of Systems: All other 10 point review of systems is negative.   Physical Exam:  height is 5\' 8"  (1.727 m) and weight is 332 lb (150.6 kg) (abnormal). His oral temperature is 99.1 F (37.3 C). His blood pressure is 178/97 (abnormal) and his pulse is 84. His respiration is 18 and oxygen saturation is 94%.   Wt Readings from Last 3 Encounters:  10/17/23 (!) 332 lb (150.6 kg)  07/18/23 (!) 335 lb (152 kg)  06/20/23 (!) 338 lb 12.8 oz (153.7 kg)    Ocular: Sclerae unicteric, pupils equal, round and reactive to light Ear-nose-throat: Oropharynx clear, dentition fair Lymphatic: No cervical, supraclavicular or axillary adenopathy Lungs no rales or rhonchi, good excursion bilaterally Heart regular rate and rhythm, no murmur appreciated Abd soft, nontender, positive bowel sounds MSK no focal spinal tenderness, no joint edema Neuro: non-focal, well-oriented, appropriate affect Breasts: Same as above.   Lab Results  Component Value Date   WBC 8.6 10/17/2023   HGB 18.0 (H) 10/17/2023   HCT 54.5 (H) 10/17/2023   MCV 82.2 10/17/2023   PLT 292 10/17/2023   No results found for: "FERRITIN", "IRON", "TIBC", "UIBC", "IRONPCTSAT" Lab Results  Component Value Date   RBC 6.63 (H) 10/17/2023   No results found for: "KPAFRELGTCHN", "LAMBDASER", "KAPLAMBRATIO" No results found for: "IGGSERUM", "IGA", "IGMSERUM" No results found for: "TOTALPROTELP", "  ALBUMINELP", "A1GS", "A2GS", "BETS", "BETA2SER", "GAMS", "MSPIKE", "SPEI"   Chemistry      Component Value Date/Time   NA 138 10/17/2023 1441   K 4.4 10/17/2023 1441   CL 101 10/17/2023 1441   CO2 29 10/17/2023 1441   BUN 18 10/17/2023 1441   CREATININE 0.94 10/17/2023 1441      Component Value Date/Time   CALCIUM 9.9 10/17/2023 1441   ALKPHOS 65 10/17/2023 1441   AST 26 10/17/2023 1441   ALT 55 (H) 10/17/2023 1441   BILITOT 0.5 10/17/2023 1441        Impression and Plan: Mr. Hrdlicka is a very pleasant 58 yo caucasian gentleman with locally advanced infiltrating ductal carcinoma of the right breast - retroareolar, ER+/PR+/HER2-.  He did have neoadjuvant therapy. He tolerated well and had a pretty good response. He still had 2/14 positive lymph nodes. He is on Femara right now and doing well. He did not wish to have a CDK4/CDK6 inhibitor. He is still at risk of recurrence.  No findings on today's exam.  Follow-up in 3 months.   Eileen Stanford, NP 3/24/20253:40 PM

## 2023-10-21 ENCOUNTER — Encounter: Payer: Self-pay | Admitting: Family Medicine

## 2023-10-23 ENCOUNTER — Other Ambulatory Visit: Payer: Self-pay | Admitting: Family

## 2023-10-23 ENCOUNTER — Other Ambulatory Visit: Payer: Self-pay | Admitting: Family Medicine

## 2023-10-23 DIAGNOSIS — E1169 Type 2 diabetes mellitus with other specified complication: Secondary | ICD-10-CM

## 2023-10-23 DIAGNOSIS — I10 Essential (primary) hypertension: Secondary | ICD-10-CM

## 2023-10-23 DIAGNOSIS — C773 Secondary and unspecified malignant neoplasm of axilla and upper limb lymph nodes: Secondary | ICD-10-CM

## 2023-10-24 ENCOUNTER — Encounter: Payer: Self-pay | Admitting: Hematology & Oncology

## 2023-10-31 ENCOUNTER — Encounter: Payer: Self-pay | Admitting: Family Medicine

## 2023-10-31 ENCOUNTER — Ambulatory Visit: Admitting: Family Medicine

## 2023-10-31 VITALS — BP 140/98 | HR 85 | Temp 98.7°F | Ht 68.0 in | Wt 332.8 lb

## 2023-10-31 DIAGNOSIS — I10 Essential (primary) hypertension: Secondary | ICD-10-CM | POA: Diagnosis not present

## 2023-10-31 MED ORDER — AMLODIPINE BESYLATE 5 MG PO TABS: 5.0000 mg | ORAL_TABLET | Freq: Every day | ORAL | 3 refills | Status: DC

## 2023-10-31 NOTE — Progress Notes (Signed)
 Chief Complaint  Patient presents with   Hypertension    Patient presents today for a hypertension follow-up   Quality Metric Gaps    Pneumococcal, urine microalbumin    Subjective Noah Taylor is a 58 y.o. male who presents for hypertension follow up. He does monitor home blood pressures. Blood pressures ranging from 130-150's/90's on average. He is compliant with medication- losartan 50 mg/d. Patient has these side effects of medication: none He is sometimes adhering to a healthy diet overall. Current exercise: none No CP or SOB.    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 Cont losartan 50 mg/d, add Norvasc 5 mg/d. Monitor BP at home. Counseled on diet and exercise. F/u in 1 mo. The patient voiced understanding and agreement to the plan.  Jilda Roche [place], DO 10/31/23  3:52 PM

## 2023-10-31 NOTE — Patient Instructions (Signed)
 Keep the diet clean and stay active.  Look up some chair yoga or consider strength training when you sit.   Check your blood pressures 2-3 times per week, alternating the time of day you check it. If it is high, considering waiting 1-2 minutes and rechecking. If it gets higher, your anxiety is likely creeping up and we should avoid rechecking.   Let us know if you need anything.

## 2023-11-02 DIAGNOSIS — C50121 Malignant neoplasm of central portion of right male breast: Secondary | ICD-10-CM | POA: Diagnosis not present

## 2023-11-02 DIAGNOSIS — Z1371 Encounter for nonprocreative screening for genetic disease carrier status: Secondary | ICD-10-CM | POA: Diagnosis not present

## 2023-11-02 DIAGNOSIS — Z170 Estrogen receptor positive status [ER+]: Secondary | ICD-10-CM | POA: Diagnosis not present

## 2023-11-22 DIAGNOSIS — E119 Type 2 diabetes mellitus without complications: Secondary | ICD-10-CM | POA: Diagnosis not present

## 2023-11-22 DIAGNOSIS — Z6841 Body Mass Index (BMI) 40.0 and over, adult: Secondary | ICD-10-CM | POA: Diagnosis not present

## 2023-11-22 DIAGNOSIS — X58XXXA Exposure to other specified factors, initial encounter: Secondary | ICD-10-CM | POA: Diagnosis not present

## 2023-11-22 DIAGNOSIS — S83241A Other tear of medial meniscus, current injury, right knee, initial encounter: Secondary | ICD-10-CM | POA: Diagnosis not present

## 2023-11-22 DIAGNOSIS — M238X1 Other internal derangements of right knee: Secondary | ICD-10-CM | POA: Diagnosis not present

## 2023-11-22 DIAGNOSIS — M958 Other specified acquired deformities of musculoskeletal system: Secondary | ICD-10-CM | POA: Diagnosis not present

## 2023-11-22 DIAGNOSIS — Z7982 Long term (current) use of aspirin: Secondary | ICD-10-CM | POA: Diagnosis not present

## 2023-11-22 DIAGNOSIS — Z7984 Long term (current) use of oral hypoglycemic drugs: Secondary | ICD-10-CM | POA: Diagnosis not present

## 2023-11-22 DIAGNOSIS — I10 Essential (primary) hypertension: Secondary | ICD-10-CM | POA: Diagnosis not present

## 2023-11-22 DIAGNOSIS — Z136 Encounter for screening for cardiovascular disorders: Secondary | ICD-10-CM | POA: Diagnosis not present

## 2023-11-22 DIAGNOSIS — Z79899 Other long term (current) drug therapy: Secondary | ICD-10-CM | POA: Diagnosis not present

## 2023-11-22 DIAGNOSIS — E669 Obesity, unspecified: Secondary | ICD-10-CM | POA: Diagnosis not present

## 2023-11-23 DIAGNOSIS — S83241D Other tear of medial meniscus, current injury, right knee, subsequent encounter: Secondary | ICD-10-CM | POA: Diagnosis not present

## 2023-11-23 DIAGNOSIS — Z09 Encounter for follow-up examination after completed treatment for conditions other than malignant neoplasm: Secondary | ICD-10-CM | POA: Diagnosis not present

## 2023-11-30 DIAGNOSIS — R29898 Other symptoms and signs involving the musculoskeletal system: Secondary | ICD-10-CM | POA: Diagnosis not present

## 2023-11-30 DIAGNOSIS — Z7409 Other reduced mobility: Secondary | ICD-10-CM | POA: Diagnosis not present

## 2023-11-30 DIAGNOSIS — M25661 Stiffness of right knee, not elsewhere classified: Secondary | ICD-10-CM | POA: Diagnosis not present

## 2023-11-30 DIAGNOSIS — M25561 Pain in right knee: Secondary | ICD-10-CM | POA: Diagnosis not present

## 2023-12-05 DIAGNOSIS — M25661 Stiffness of right knee, not elsewhere classified: Secondary | ICD-10-CM | POA: Diagnosis not present

## 2023-12-05 DIAGNOSIS — R29898 Other symptoms and signs involving the musculoskeletal system: Secondary | ICD-10-CM | POA: Diagnosis not present

## 2023-12-05 DIAGNOSIS — Z7409 Other reduced mobility: Secondary | ICD-10-CM | POA: Diagnosis not present

## 2023-12-05 DIAGNOSIS — M25561 Pain in right knee: Secondary | ICD-10-CM | POA: Diagnosis not present

## 2023-12-07 DIAGNOSIS — M25561 Pain in right knee: Secondary | ICD-10-CM | POA: Diagnosis not present

## 2023-12-07 DIAGNOSIS — M25661 Stiffness of right knee, not elsewhere classified: Secondary | ICD-10-CM | POA: Diagnosis not present

## 2023-12-07 DIAGNOSIS — Z09 Encounter for follow-up examination after completed treatment for conditions other than malignant neoplasm: Secondary | ICD-10-CM | POA: Diagnosis not present

## 2023-12-07 DIAGNOSIS — R29898 Other symptoms and signs involving the musculoskeletal system: Secondary | ICD-10-CM | POA: Diagnosis not present

## 2023-12-07 DIAGNOSIS — Z7409 Other reduced mobility: Secondary | ICD-10-CM | POA: Diagnosis not present

## 2023-12-12 DIAGNOSIS — Z7409 Other reduced mobility: Secondary | ICD-10-CM | POA: Diagnosis not present

## 2023-12-12 DIAGNOSIS — M25561 Pain in right knee: Secondary | ICD-10-CM | POA: Diagnosis not present

## 2023-12-12 DIAGNOSIS — R29898 Other symptoms and signs involving the musculoskeletal system: Secondary | ICD-10-CM | POA: Diagnosis not present

## 2023-12-12 DIAGNOSIS — M25661 Stiffness of right knee, not elsewhere classified: Secondary | ICD-10-CM | POA: Diagnosis not present

## 2023-12-14 DIAGNOSIS — M25561 Pain in right knee: Secondary | ICD-10-CM | POA: Diagnosis not present

## 2023-12-14 DIAGNOSIS — Z7409 Other reduced mobility: Secondary | ICD-10-CM | POA: Diagnosis not present

## 2023-12-14 DIAGNOSIS — R29898 Other symptoms and signs involving the musculoskeletal system: Secondary | ICD-10-CM | POA: Diagnosis not present

## 2023-12-14 DIAGNOSIS — M25661 Stiffness of right knee, not elsewhere classified: Secondary | ICD-10-CM | POA: Diagnosis not present

## 2023-12-15 ENCOUNTER — Encounter: Payer: Self-pay | Admitting: Family Medicine

## 2023-12-20 DIAGNOSIS — R29898 Other symptoms and signs involving the musculoskeletal system: Secondary | ICD-10-CM | POA: Diagnosis not present

## 2023-12-20 DIAGNOSIS — Z7409 Other reduced mobility: Secondary | ICD-10-CM | POA: Diagnosis not present

## 2023-12-20 DIAGNOSIS — M25561 Pain in right knee: Secondary | ICD-10-CM | POA: Diagnosis not present

## 2023-12-20 DIAGNOSIS — M25661 Stiffness of right knee, not elsewhere classified: Secondary | ICD-10-CM | POA: Diagnosis not present

## 2023-12-22 DIAGNOSIS — Z7409 Other reduced mobility: Secondary | ICD-10-CM | POA: Diagnosis not present

## 2023-12-22 DIAGNOSIS — M25561 Pain in right knee: Secondary | ICD-10-CM | POA: Diagnosis not present

## 2023-12-22 DIAGNOSIS — M25661 Stiffness of right knee, not elsewhere classified: Secondary | ICD-10-CM | POA: Diagnosis not present

## 2023-12-22 DIAGNOSIS — R29898 Other symptoms and signs involving the musculoskeletal system: Secondary | ICD-10-CM | POA: Diagnosis not present

## 2023-12-26 DIAGNOSIS — Z7409 Other reduced mobility: Secondary | ICD-10-CM | POA: Diagnosis not present

## 2023-12-26 DIAGNOSIS — R29898 Other symptoms and signs involving the musculoskeletal system: Secondary | ICD-10-CM | POA: Diagnosis not present

## 2023-12-26 DIAGNOSIS — M25561 Pain in right knee: Secondary | ICD-10-CM | POA: Diagnosis not present

## 2023-12-26 DIAGNOSIS — M25661 Stiffness of right knee, not elsewhere classified: Secondary | ICD-10-CM | POA: Diagnosis not present

## 2023-12-28 DIAGNOSIS — M25661 Stiffness of right knee, not elsewhere classified: Secondary | ICD-10-CM | POA: Diagnosis not present

## 2023-12-28 DIAGNOSIS — Z7409 Other reduced mobility: Secondary | ICD-10-CM | POA: Diagnosis not present

## 2023-12-28 DIAGNOSIS — R29898 Other symptoms and signs involving the musculoskeletal system: Secondary | ICD-10-CM | POA: Diagnosis not present

## 2023-12-28 DIAGNOSIS — M25561 Pain in right knee: Secondary | ICD-10-CM | POA: Diagnosis not present

## 2024-01-03 DIAGNOSIS — Z7409 Other reduced mobility: Secondary | ICD-10-CM | POA: Diagnosis not present

## 2024-01-03 DIAGNOSIS — M25561 Pain in right knee: Secondary | ICD-10-CM | POA: Diagnosis not present

## 2024-01-03 DIAGNOSIS — R29898 Other symptoms and signs involving the musculoskeletal system: Secondary | ICD-10-CM | POA: Diagnosis not present

## 2024-01-03 DIAGNOSIS — M25661 Stiffness of right knee, not elsewhere classified: Secondary | ICD-10-CM | POA: Diagnosis not present

## 2024-01-04 DIAGNOSIS — Z09 Encounter for follow-up examination after completed treatment for conditions other than malignant neoplasm: Secondary | ICD-10-CM | POA: Diagnosis not present

## 2024-01-05 DIAGNOSIS — Z7409 Other reduced mobility: Secondary | ICD-10-CM | POA: Diagnosis not present

## 2024-01-05 DIAGNOSIS — R29898 Other symptoms and signs involving the musculoskeletal system: Secondary | ICD-10-CM | POA: Diagnosis not present

## 2024-01-05 DIAGNOSIS — M25661 Stiffness of right knee, not elsewhere classified: Secondary | ICD-10-CM | POA: Diagnosis not present

## 2024-01-05 DIAGNOSIS — M25561 Pain in right knee: Secondary | ICD-10-CM | POA: Diagnosis not present

## 2024-01-06 ENCOUNTER — Encounter: Payer: Self-pay | Admitting: Family Medicine

## 2024-01-06 ENCOUNTER — Ambulatory Visit (INDEPENDENT_AMBULATORY_CARE_PROVIDER_SITE_OTHER): Admitting: Family Medicine

## 2024-01-06 VITALS — BP 130/82 | HR 82 | Temp 98.0°F | Resp 16 | Ht 68.0 in | Wt 333.0 lb

## 2024-01-06 DIAGNOSIS — Z7985 Long-term (current) use of injectable non-insulin antidiabetic drugs: Secondary | ICD-10-CM | POA: Diagnosis not present

## 2024-01-06 DIAGNOSIS — Z7984 Long term (current) use of oral hypoglycemic drugs: Secondary | ICD-10-CM

## 2024-01-06 DIAGNOSIS — Z23 Encounter for immunization: Secondary | ICD-10-CM | POA: Diagnosis not present

## 2024-01-06 DIAGNOSIS — Z125 Encounter for screening for malignant neoplasm of prostate: Secondary | ICD-10-CM | POA: Diagnosis not present

## 2024-01-06 DIAGNOSIS — E669 Obesity, unspecified: Secondary | ICD-10-CM

## 2024-01-06 DIAGNOSIS — E1169 Type 2 diabetes mellitus with other specified complication: Secondary | ICD-10-CM

## 2024-01-06 DIAGNOSIS — Z0000 Encounter for general adult medical examination without abnormal findings: Secondary | ICD-10-CM

## 2024-01-06 DIAGNOSIS — Z6841 Body Mass Index (BMI) 40.0 and over, adult: Secondary | ICD-10-CM

## 2024-01-06 MED ORDER — TIRZEPATIDE 15 MG/0.5ML ~~LOC~~ SOAJ: 15.0000 mg | SUBCUTANEOUS | 1 refills | Status: DC

## 2024-01-06 MED ORDER — TIRZEPATIDE 5 MG/0.5ML ~~LOC~~ SOAJ: 5.0000 mg | SUBCUTANEOUS | 0 refills | Status: DC

## 2024-01-06 MED ORDER — TIRZEPATIDE 7.5 MG/0.5ML ~~LOC~~ SOAJ: 7.5000 mg | SUBCUTANEOUS | 0 refills | Status: DC

## 2024-01-06 MED ORDER — TIRZEPATIDE 10 MG/0.5ML ~~LOC~~ SOAJ
10.0000 mg | SUBCUTANEOUS | 0 refills | Status: DC
Start: 2024-03-30 — End: 2024-02-09

## 2024-01-06 MED ORDER — TIRZEPATIDE 2.5 MG/0.5ML ~~LOC~~ SOAJ
2.5000 mg | SUBCUTANEOUS | 0 refills | Status: AC
Start: 2024-01-06 — End: 2024-02-03

## 2024-01-06 MED ORDER — TIRZEPATIDE 12.5 MG/0.5ML ~~LOC~~ SOAJ
12.5000 mg | SUBCUTANEOUS | 0 refills | Status: DC
Start: 2024-04-27 — End: 2024-02-09

## 2024-01-06 NOTE — Progress Notes (Signed)
 Chief Complaint  Patient presents with   Annual Exam    CPE    Well Male Noah Taylor is here for a complete physical.   His last physical was >1 year ago.  Current diet: in general, diet could be better Current exercise: limited 2/2 recent surgery Weight trend: stable Fatigue out of ordinary? No. Seat belt? Yes.   Advanced directive? No  Health maintenance Shingrix - Yes Colonoscopy- Yes Tetanus- Yes HIV- Yes Hep C- Yes  Patient has a history of diabetes.  He is compliant with metformin  1000 mg twice daily and Farxiga  10 mg daily.  Diet/exercise as above.  He has been checking his sugars recently and they have been running in the high 100s and low 200s.  We were considering Mounjaro several months ago but with his upcoming knee surgery, decided to hold off on it.  He is interested in starting something now.  No hypoglycemic episodes.  He is due for an updated pneumonia vaccine.  He is up-to-date with his eye examination.   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 30 tablet 3   FARXIGA  10 MG TABS tablet TAKE 1 TABLET DAILY 90 tablet 3   fenofibrate  (TRICOR ) 48 MG tablet TAKE 1 TABLET DAILY 90 tablet 3   letrozole  (FEMARA ) 2.5 MG tablet TAKE 1 TABLET DAILY 90 tablet 3   losartan  (COZAAR ) 50 MG tablet TAKE 1 TABLET DAILY 90 tablet 3   metFORMIN  (GLUCOPHAGE ) 1000 MG tablet TAKE 1 TABLET TWICE A DAY WITH MEALS 180 tablet 3   pravastatin  (PRAVACHOL ) 20 MG tablet TAKE 1 TABLET DAILY 90 tablet 3    Allergies No Known Allergies  Family History Family History  Problem Relation Age of Onset   Arthritis Mother    Cancer Mother        ovarian   Hyperlipidemia Father    Hypertension Father    Cancer Father        lung cancer   Colon cancer Neg Hx    Colon polyps Neg Hx    Esophageal cancer Neg Hx    Rectal cancer Neg Hx    Stomach cancer Neg Hx     Review of Systems: Constitutional:  no fevers Eye:  no recent significant change in vision Ear/Nose/Mouth/Throat:  Ears:  no hearing loss Nose/Mouth/Throat:  no complaints of nasal congestion, no sore throat Cardiovascular:  no chest pain Respiratory:  no shortness of breath Gastrointestinal:  no change in bowel habits GU:  Male: negative for dysuria, frequency Musculoskeletal/Extremities:  no joint pain Integumentary (Skin/Breast):  no abnormal skin lesions reported Neurologic:  no headaches Endocrine: No unexpected weight changes Hematologic/Lymphatic:  no abnormal bleeding  Exam BP 130/82 (BP Location: Left Arm, Patient Position: Sitting)   Pulse 82   Temp 98 F (36.7 C) (Oral)   Resp 16   Ht 5' 8 (1.727 m)   Wt (!) 333 lb (151 kg)   SpO2 95%   BMI 50.63 kg/m  General:  well developed, well nourished, in no apparent distress Skin:  no significant moles, warts, or growths Head:  no masses, lesions, or tenderness Eyes:  pupils equal and round, sclera anicteric without injection Ears:  canals without lesions, TMs shiny without retraction, no obvious effusion, no erythema Nose:  nares patent, mucosa normal Throat/Pharynx:  lips and gingiva without lesion; tongue and uvula midline; non-inflamed pharynx; no exudates or postnasal drainage Neck: neck supple without adenopathy, thyromegaly, or masses Cardiac: RRR, no bruits, no LE edema Lungs:  clear to auscultation, breath sounds equal bilaterally, no respiratory distress Abdomen: BS+, soft, non-tender, non-distended, no masses or organomegaly noted Rectal:  Deferred Musculoskeletal:  symmetrical muscle groups noted without atrophy or deformity Neuro:  gait normal; deep tendon reflexes normal and symmetric Psych: well oriented with normal range of affect and appropriate judgment/insight  Assessment and Plan  Well adult exam - Plan: CBC, Comprehensive metabolic panel with GFR, Lipid panel  Type 2 diabetes mellitus with obesity (HCC) - Plan: Hemoglobin A1c, Microalbumin / creatinine urine ratio, tirzepatide (MOUNJARO) 2.5 MG/0.5ML Pen, tirzepatide (MOUNJARO) 5 MG/0.5ML Pen, tirzepatide (MOUNJARO) 7.5 MG/0.5ML Pen, tirzepatide (MOUNJARO) 10 MG/0.5ML Pen, tirzepatide (MOUNJARO) 12.5 MG/0.5ML Pen, tirzepatide (MOUNJARO) 15 MG/0.5ML Pen  Screening for prostate cancer - Plan: PSA   Well 58 y.o. male. Counseled on diet and exercise. Counseled on risks and benefits of prostate cancer screening with PSA. The patient agrees to undergo testing. Diabetes: Chronic, not controlled.  Continue Farxiga  10 mg daily, metformin  1000 mg twice daily.  Add Mounjaro 2.5 mg weekly and increase every 4 weeks.  Monitor blood sugar at home.  If A1c is not controlled, I will see him in 3 months, otherwise he will follow-up in 6 months.  Hopefully this can help with weight loss as well. Advanced directive form provided today.  Immunizations, labs, and further orders as above. The patient voiced understanding and agreement to the plan.  Shellie Dials [place], DO 01/06/24 10:56 AM

## 2024-01-06 NOTE — Addendum Note (Signed)
 Addended by: Caytlin Better M on: 01/06/2024 11:17 AM   Modules accepted: Orders

## 2024-01-06 NOTE — Patient Instructions (Signed)
 Give [place] 2-3 business days to get the results of your labs back.   Keep the diet clean and stay active.  Please get me a copy of your advanced directive form at your convenience.   Let us know if you need anything.

## 2024-01-07 ENCOUNTER — Ambulatory Visit: Payer: Self-pay | Admitting: Family Medicine

## 2024-01-07 LAB — COMPREHENSIVE METABOLIC PANEL WITH GFR
AG Ratio: 1.9 (calc) (ref 1.0–2.5)
ALT: 59 U/L — ABNORMAL HIGH (ref 9–46)
AST: 33 U/L (ref 10–35)
Albumin: 4.7 g/dL (ref 3.6–5.1)
Alkaline phosphatase (APISO): 74 U/L (ref 35–144)
BUN: 14 mg/dL (ref 7–25)
CO2: 26 mmol/L (ref 20–32)
Calcium: 10.1 mg/dL (ref 8.6–10.3)
Chloride: 101 mmol/L (ref 98–110)
Creat: 0.86 mg/dL (ref 0.70–1.30)
Globulin: 2.5 g/dL (ref 1.9–3.7)
Glucose, Bld: 116 mg/dL — ABNORMAL HIGH (ref 65–99)
Potassium: 4.5 mmol/L (ref 3.5–5.3)
Sodium: 137 mmol/L (ref 135–146)
Total Bilirubin: 0.7 mg/dL (ref 0.2–1.2)
Total Protein: 7.2 g/dL (ref 6.1–8.1)
eGFR: 100 mL/min/{1.73_m2} (ref >=60)

## 2024-01-07 LAB — PSA: PSA: 1.73 ng/mL (ref <=4.00)

## 2024-01-07 LAB — LIPID PANEL
Cholesterol: 188 mg/dL (ref <200)
HDL: 52 mg/dL (ref >=40)
LDL Cholesterol (Calc): 107 mg/dL — ABNORMAL HIGH
Non-HDL Cholesterol (Calc): 136 mg/dL — ABNORMAL HIGH (ref <130)
Total CHOL/HDL Ratio: 3.6 (calc) (ref <5.0)
Triglycerides: 169 mg/dL — ABNORMAL HIGH (ref <150)

## 2024-01-07 LAB — MICROALBUMIN / CREATININE URINE RATIO
Creatinine, Urine: 78 mg/dL (ref 20–320)
Microalb Creat Ratio: 27 mg/g{creat} (ref <30)
Microalb, Ur: 2.1 mg/dL

## 2024-01-07 LAB — CBC
HCT: 57.2 % — ABNORMAL HIGH (ref 38.5–50.0)
Hemoglobin: 18 g/dL — ABNORMAL HIGH (ref 13.2–17.1)
MCH: 26.4 pg — ABNORMAL LOW (ref 27.0–33.0)
MCHC: 31.5 g/dL — ABNORMAL LOW (ref 32.0–36.0)
MCV: 83.7 fL (ref 80.0–100.0)
MPV: 9.9 fL (ref 7.5–12.5)
Platelets: 293 10*3/uL (ref 140–400)
RBC: 6.83 10*6/uL — ABNORMAL HIGH (ref 4.20–5.80)
RDW: 13.9 % (ref 11.0–15.0)
WBC: 7.2 10*3/uL (ref 3.8–10.8)

## 2024-01-07 LAB — HEMOGLOBIN A1C
Hgb A1c MFr Bld: 7.7 % — ABNORMAL HIGH (ref <5.7)
Mean Plasma Glucose: 174 mg/dL
eAG (mmol/L): 9.7 mmol/L

## 2024-01-10 DIAGNOSIS — M25561 Pain in right knee: Secondary | ICD-10-CM | POA: Diagnosis not present

## 2024-01-10 DIAGNOSIS — R29898 Other symptoms and signs involving the musculoskeletal system: Secondary | ICD-10-CM | POA: Diagnosis not present

## 2024-01-10 DIAGNOSIS — Z7409 Other reduced mobility: Secondary | ICD-10-CM | POA: Diagnosis not present

## 2024-01-10 DIAGNOSIS — M25661 Stiffness of right knee, not elsewhere classified: Secondary | ICD-10-CM | POA: Diagnosis not present

## 2024-01-18 ENCOUNTER — Inpatient Hospital Stay

## 2024-01-18 ENCOUNTER — Ambulatory Visit: Admitting: Hematology & Oncology

## 2024-01-29 ENCOUNTER — Other Ambulatory Visit: Payer: Self-pay | Admitting: Family Medicine

## 2024-01-29 DIAGNOSIS — E119 Type 2 diabetes mellitus without complications: Secondary | ICD-10-CM

## 2024-01-31 ENCOUNTER — Telehealth: Payer: Self-pay

## 2024-01-31 ENCOUNTER — Other Ambulatory Visit (HOSPITAL_COMMUNITY): Payer: Self-pay

## 2024-01-31 ENCOUNTER — Encounter: Payer: Self-pay | Admitting: Family Medicine

## 2024-01-31 DIAGNOSIS — E1169 Type 2 diabetes mellitus with other specified complication: Secondary | ICD-10-CM

## 2024-01-31 NOTE — Telephone Encounter (Signed)
 Per test claim: PA required and submitted KEY/EOC/Request #: [id number] due to: Drug is covered by current benefit plan. No further PA activity needed   Per test claim: The current 28 day co-pay is, $30.  No PA needed at this time. This test claim was processed through [facility]- copay amounts may vary at other pharmacies due to pharmacy/plan contracts, or as the patient moves through the different stages of their insurance plan.

## 2024-01-31 NOTE — Telephone Encounter (Signed)
 Pharmacy Patient Advocate Encounter   Received notification from Pt Calls Messages that prior authorization for Mounjaro  5MG /0.5ML auto-injectors is required/requested.   Insurance verification completed.   The patient is insured through [employer] .   Per test claim: PA required and submitted KEY/EOC/Request #: [id number] due to: Drug is covered by current benefit plan. No further PA activity needed   Per test claim: The current 28 day co-pay is, $30.  No PA needed at this time. This test claim was processed through [facility]- copay amounts may vary at other pharmacies due to pharmacy/plan contracts, or as the patient moves through the different stages of their insurance plan.

## 2024-01-31 NOTE — Telephone Encounter (Signed)
 Called pt was advised, stated understand.

## 2024-02-05 ENCOUNTER — Other Ambulatory Visit: Payer: Self-pay | Admitting: Family Medicine

## 2024-02-09 ENCOUNTER — Inpatient Hospital Stay: Admitting: Hematology & Oncology

## 2024-02-09 ENCOUNTER — Other Ambulatory Visit: Payer: Self-pay

## 2024-02-09 ENCOUNTER — Encounter: Payer: Self-pay | Admitting: Hematology & Oncology

## 2024-02-09 ENCOUNTER — Inpatient Hospital Stay: Attending: Hematology & Oncology

## 2024-02-09 VITALS — BP 130/83 | HR 75 | Temp 98.7°F | Resp 18 | Wt 323.0 lb

## 2024-02-09 DIAGNOSIS — Z1732 Human epidermal growth factor receptor 2 negative status: Secondary | ICD-10-CM | POA: Insufficient documentation

## 2024-02-09 DIAGNOSIS — C50921 Malignant neoplasm of unspecified site of right male breast: Secondary | ICD-10-CM

## 2024-02-09 DIAGNOSIS — C773 Secondary and unspecified malignant neoplasm of axilla and upper limb lymph nodes: Secondary | ICD-10-CM

## 2024-02-09 DIAGNOSIS — Z79811 Long term (current) use of aromatase inhibitors: Secondary | ICD-10-CM | POA: Insufficient documentation

## 2024-02-09 DIAGNOSIS — Z1721 Progesterone receptor positive status: Secondary | ICD-10-CM | POA: Insufficient documentation

## 2024-02-09 DIAGNOSIS — Z170 Estrogen receptor positive status [ER+]: Secondary | ICD-10-CM | POA: Insufficient documentation

## 2024-02-09 DIAGNOSIS — C50021 Malignant neoplasm of nipple and areola, right male breast: Secondary | ICD-10-CM | POA: Insufficient documentation

## 2024-02-09 LAB — CBC WITH DIFFERENTIAL (CANCER CENTER ONLY)
Abs Immature Granulocytes: 0.05 K/uL (ref 0.00–0.07)
Basophils Absolute: 0 K/uL (ref 0.0–0.1)
Basophils Relative: 0 %
Eosinophils Absolute: 0.2 K/uL (ref 0.0–0.5)
Eosinophils Relative: 2 %
HCT: 54.5 % — ABNORMAL HIGH (ref 39.0–52.0)
Hemoglobin: 18.1 g/dL — ABNORMAL HIGH (ref 13.0–17.0)
Immature Granulocytes: 1 %
Lymphocytes Relative: 25 %
Lymphs Abs: 2.4 K/uL (ref 0.7–4.0)
MCH: 27.3 pg (ref 26.0–34.0)
MCHC: 33.2 g/dL (ref 30.0–36.0)
MCV: 82.1 fL (ref 80.0–100.0)
Monocytes Absolute: 1.1 K/uL — ABNORMAL HIGH (ref 0.1–1.0)
Monocytes Relative: 11 %
Neutro Abs: 5.7 K/uL (ref 1.7–7.7)
Neutrophils Relative %: 61 %
Platelet Count: 325 K/uL (ref 150–400)
RBC: 6.64 MIL/uL — ABNORMAL HIGH (ref 4.22–5.81)
RDW: 14.9 % (ref 11.5–15.5)
WBC Count: 9.4 K/uL (ref 4.0–10.5)
nRBC: 0 % (ref 0.0–0.2)

## 2024-02-09 LAB — CMP (CANCER CENTER ONLY)
ALT: 38 U/L (ref 0–44)
AST: 21 U/L (ref 15–41)
Albumin: 4.8 g/dL (ref 3.5–5.0)
Alkaline Phosphatase: 71 U/L (ref 38–126)
Anion gap: 7 (ref 5–15)
BUN: 14 mg/dL (ref 6–20)
CO2: 30 mmol/L (ref 22–32)
Calcium: 10.7 mg/dL — ABNORMAL HIGH (ref 8.9–10.3)
Chloride: 103 mmol/L (ref 98–111)
Creatinine: 1.16 mg/dL (ref 0.61–1.24)
GFR, Estimated: >60 mL/min (ref >60)
Glucose, Bld: 95 mg/dL (ref 70–99)
Potassium: 4.9 mmol/L (ref 3.5–5.1)
Sodium: 140 mmol/L (ref 135–145)
Total Bilirubin: 0.6 mg/dL (ref 0.0–1.2)
Total Protein: 7.7 g/dL (ref 6.5–8.1)

## 2024-02-09 LAB — LACTATE DEHYDROGENASE: LDH: 124 U/L (ref 98–192)

## 2024-02-09 NOTE — Progress Notes (Signed)
 Hematology and Oncology Follow Up Visit  Noah Taylor [id number] 23-Feb-1966 58 y.o. 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  He and his wife have been pretty busy this summer.  They actually were down in [place] .  They are cleaning out his Mom's house who passed away a few months ago.  They are down there for 4 days.  He is still working.  He is pretty busy at work.  He has had no problems with the Femara .  He does not really have a lot of hot flashes or sweats.  He has had no issues with nausea or vomiting.  He has had no headache.  He has had no cough or shortness of breath.  He has had no change in bowel or bladder habits.  There is been no leg swelling.  He has had no rashes.  Of note, he had a PSA of 1.73 back in June.  He is on Mounjaro  now.  This is to try to help with his diabetes.  Overall, I would have to say that his performance status is probably ECOG 0.  Medications:  Allergies as of 02/09/2024   No Known Allergies      Medication List        Accurate as of February 09, 2024  4:28 PM. If you have any questions, ask your nurse or doctor.          amLODipine  5 MG tablet Commonly known as: NORVASC  Take 1 tablet (5 mg total) by mouth daily.   Farxiga  10 MG Tabs tablet Generic drug: dapagliflozin  propanediol TAKE 1 TABLET DAILY   fenofibrate  48 MG tablet Commonly known as: TRICOR  TAKE 1 TABLET DAILY   letrozole  2.5 MG tablet Commonly known as: FEMARA  TAKE 1 TABLET DAILY   losartan  50 MG tablet Commonly known as: COZAAR  TAKE 1 TABLET DAILY   metFORMIN  1000 MG  tablet Commonly known as: GLUCOPHAGE  TAKE 1 TABLET TWICE A DAY WITH MEALS   pravastatin  20 MG tablet Commonly known as: PRAVACHOL  TAKE 1 TABLET DAILY   tirzepatide  15 MG/0.5ML Pen Commonly known as: MOUNJARO  Inject 15 mg into the skin once a week. Start taking on: May 25, 2024 What changed: Another medication with the same name was removed. Continue taking this medication, and follow the directions you see here. Changed by: Maude JONELLE Crease        Allergies: No Known Allergies  Past Medical History, Surgical history, Social history, and Family History were reviewed and updated.  Review of Systems: Review of Systems  Constitutional: Negative.   HENT: Negative.    Eyes: Negative.   Respiratory: Negative.    Cardiovascular: Negative.   Gastrointestinal: Negative.   Genitourinary: Negative.   Musculoskeletal: Negative.   Skin: Negative.   Neurological: Negative.   Endo/Heme/Allergies: Negative.   Psychiatric/Behavioral: Negative.       Physical Exam:  weight is 323 lb (146.5 kg) (abnormal). His oral temperature is 98.7 F (37.1 C). His blood pressure is 130/83 and his pulse is 75. His respiration is 18 and oxygen saturation is 94%.   Vital signs show temperature of 98.7.  Pulse 75.  Blood pressure 130/83.  Weight is 323 pounds.    Physical Exam Vitals reviewed.  Constitutional:      Comments: Breast exam shows bilateral mastectomies.  He does have some hyperpigmentation on the right chest wall.  He still has the surgical scar intact.  There is no tenderness.  There is little bit of warmth on the right side.  He has no axillary adenopathy bilaterally.  HENT:     Head: Normocephalic and atraumatic.  Eyes:     Pupils: Pupils are equal, round, and reactive to light.  Cardiovascular:     Rate and Rhythm: Normal rate and regular rhythm.     Heart sounds: Normal heart sounds.  Pulmonary:     Effort: Pulmonary effort is normal.     Breath sounds: Normal breath  sounds.  Abdominal:     General: Bowel sounds are normal.     Palpations: Abdomen is soft.  Musculoskeletal:        General: No tenderness or deformity. Normal range of motion.     Cervical back: Normal range of motion.  Lymphadenopathy:     Cervical: No cervical adenopathy.  Skin:    General: Skin is warm and dry.     Findings: No erythema or rash.     Comments: Skin exam shows some hyperpigmentation in the area of his radiation of the right anterior chest wall.  There is no skin breakdown.  Neurological:     Mental Status: He is alert and oriented to person, place, and time.  Psychiatric:        Behavior: Behavior normal.        Thought Content: Thought content normal.        Judgment: Judgment normal.     Lab Results  Component Value Date   WBC 9.4 02/09/2024   HGB 18.1 (H) 02/09/2024   HCT 54.5 (H) 02/09/2024   MCV 82.1 02/09/2024   PLT 325 02/09/2024   No results found for: FERRITIN, IRON, TIBC, UIBC, IRONPCTSAT Lab Results  Component Value Date   RBC 6.64 (H) 02/09/2024   No results found for: KPAFRELGTCHN, LAMBDASER, KAPLAMBRATIO No results found for: IGGSERUM, IGA, IGMSERUM No results found for: STEPHANY CARLOTA BENSON MARKEL EARLA JOANNIE DOC VICK, SPEI   Chemistry      Component Value Date/Time   NA 140 02/09/2024 1510   K 4.9 02/09/2024 1510   CL 103 02/09/2024 1510   CO2 30 02/09/2024 1510   BUN 14 02/09/2024 1510   CREATININE 1.16 02/09/2024 1510   CREATININE 0.86 01/06/2024 1111      Component Value Date/Time   CALCIUM 10.7 (H) 02/09/2024 1510   ALKPHOS 71 02/09/2024 1510   AST 21 02/09/2024 1510   ALT 38 02/09/2024 1510   BILITOT 0.6 02/09/2024 1510       Impression and Plan: Noah Taylor is a very pleasant 58 yo caucasian gentleman with locally advanced infiltrating ductal carcinoma of the right breast - retroareolar, ER+/PR+/HER2-.   He did have neoadjuvant therapy.  He did well with this.   He did have a decent response.  He still had 2/14 positive lymph nodes.  He is on Femara  right now.  So far, he is doing pretty well.  He did not wish to have a CDK4/CDK6 inhibitor.  We will plan to get him back in November.  Hopefully, if everything looks fine, then we can start moving his appointments out a little bit longer.  As always, it is always  fun talking to he and his wife.  He has such a good attitude.  He has such good support from his wife.      Maude JONELLE Crease, MD 7/17/20254:28 PM

## 2024-02-15 DIAGNOSIS — Z09 Encounter for follow-up examination after completed treatment for conditions other than malignant neoplasm: Secondary | ICD-10-CM | POA: Diagnosis not present

## 2024-02-24 ENCOUNTER — Other Ambulatory Visit: Payer: Self-pay

## 2024-02-24 ENCOUNTER — Telehealth: Payer: Self-pay | Admitting: Pharmacy Technician

## 2024-02-24 ENCOUNTER — Other Ambulatory Visit (HOSPITAL_COMMUNITY): Payer: Self-pay

## 2024-02-24 ENCOUNTER — Telehealth: Payer: Self-pay

## 2024-02-24 MED ORDER — TIRZEPATIDE 7.5 MG/0.5ML ~~LOC~~ SOAJ: 7.5000 mg | SUBCUTANEOUS | 0 refills | Status: AC

## 2024-02-24 NOTE — Telephone Encounter (Signed)
 Pharmacy Patient Advocate Encounter   Received notification from Patient Advice Request messages that prior authorization for MOUNJARO  is required/requested.   Insurance verification completed.   The patient is insured through [employer] .   Per test claim: Refill too soon. PA is not needed at this time. Medication was filled 02/03/24.   MAX ONE GLP1 CLAIM PER 21 DAYS

## 2024-02-24 NOTE — Telephone Encounter (Signed)
 Pharmacy Patient Advocate Encounter   Received notification from CoverMyMeds that prior authorization for Mounjaro  7.5MG /0.5ML auto-injectors is required/requested.   Insurance verification completed.   The patient is insured through [employer] .  Action: Medication has been discontinued. Archived Key: [id number]

## 2024-02-27 ENCOUNTER — Other Ambulatory Visit: Payer: Self-pay

## 2024-02-27 ENCOUNTER — Other Ambulatory Visit (HOSPITAL_COMMUNITY): Payer: Self-pay

## 2024-02-27 DIAGNOSIS — I10 Essential (primary) hypertension: Secondary | ICD-10-CM

## 2024-02-27 MED ORDER — AMLODIPINE BESYLATE 5 MG PO TABS: 5.0000 mg | ORAL_TABLET | Freq: Every day | ORAL | 3 refills | Status: DC

## 2024-02-27 NOTE — Telephone Encounter (Signed)
 Pharmacy Patient Advocate Encounter   Received notification from Patient Advice Request messages that prior authorization for MOUNJARO  is required/requested.   Insurance verification completed.   The patient is insured through [employer] .   Per test claim: The current 28 day co-pay is, $30.  No PA needed at this time. This test claim was processed through [facility]- copay amounts may vary at other pharmacies due to pharmacy/plan contracts, or as the patient moves through the different stages of their insurance plan.

## 2024-04-11 ENCOUNTER — Ambulatory Visit (INDEPENDENT_AMBULATORY_CARE_PROVIDER_SITE_OTHER): Admitting: Family Medicine

## 2024-04-11 ENCOUNTER — Encounter: Payer: Self-pay | Admitting: Family Medicine

## 2024-04-11 ENCOUNTER — Ambulatory Visit: Payer: Self-pay | Admitting: Family Medicine

## 2024-04-11 ENCOUNTER — Other Ambulatory Visit: Payer: Self-pay

## 2024-04-11 VITALS — BP 128/82 | HR 86 | Temp 98.0°F | Resp 16 | Ht 68.0 in | Wt 303.0 lb

## 2024-04-11 DIAGNOSIS — E782 Mixed hyperlipidemia: Secondary | ICD-10-CM

## 2024-04-11 DIAGNOSIS — Z7984 Long term (current) use of oral hypoglycemic drugs: Secondary | ICD-10-CM | POA: Diagnosis not present

## 2024-04-11 DIAGNOSIS — E669 Obesity, unspecified: Secondary | ICD-10-CM

## 2024-04-11 DIAGNOSIS — E1169 Type 2 diabetes mellitus with other specified complication: Secondary | ICD-10-CM

## 2024-04-11 LAB — COMPREHENSIVE METABOLIC PANEL WITH GFR
ALT: 35 U/L (ref 0–53)
AST: 19 U/L (ref 0–37)
Albumin: 4.7 g/dL (ref 3.5–5.2)
Alkaline Phosphatase: 64 U/L (ref 39–117)
BUN: 14 mg/dL (ref 6–23)
CO2: 28 meq/L (ref 19–32)
Calcium: 10.2 mg/dL (ref 8.4–10.5)
Chloride: 103 meq/L (ref 96–112)
Creatinine, Ser: 0.91 mg/dL (ref 0.40–1.50)
GFR: 92.47 mL/min (ref >60.00)
Glucose, Bld: 90 mg/dL (ref 70–99)
Potassium: 4.4 meq/L (ref 3.5–5.1)
Sodium: 139 meq/L (ref 135–145)
Total Bilirubin: 0.8 mg/dL (ref 0.2–1.2)
Total Protein: 7.4 g/dL (ref 6.0–8.3)

## 2024-04-11 LAB — LIPID PANEL
Cholesterol: 134 mg/dL (ref 0–200)
HDL: 38 mg/dL — ABNORMAL LOW (ref >39.00)
LDL Cholesterol: 65 mg/dL (ref 0–99)
NonHDL: 96.45
Total CHOL/HDL Ratio: 4
Triglycerides: 158 mg/dL — ABNORMAL HIGH (ref 0.0–149.0)
VLDL: 31.6 mg/dL (ref 0.0–40.0)

## 2024-04-11 LAB — HEMOGLOBIN A1C: Hgb A1c MFr Bld: 6.2 % (ref 4.6–6.5)

## 2024-04-11 NOTE — Progress Notes (Signed)
 Subjective:   Chief Complaint  Patient presents with   Follow-up    Follow Up    Noah Taylor is a 58 y.o. male here for follow-up of diabetes.   Noah Taylor's self monitored glucose range is low 100's.  Patient denies hypoglycemic reactions. He checks his glucose levels several time(s) per week. Patient does not require insulin.   Medications include: Mounjaro  10 mg/week, metformin  1000 mg bid, Farxiga  10 mg/d Diet is good.  Exercise: walking  Hyperlipidemia Patient presents for dyslipidemia follow up. Currently being treated with pravastatin  20 mg/d and Tricor  48 mg/d and compliance with treatment thus far has been good. He denies myalgias. Diet/exercise as above.  The patient is not known to have coexisting coronary artery disease.  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  Warm, no pallor or diaphoresis Head:  Normocephalic, atraumatic Eyes:  Pupils equal and round, sclera anicteric without injection  Lungs:  CTAB, no access msc use Cardio:  RRR, no bruits, no LE edema Musculoskeletal:  Symmetrical muscle groups noted without atrophy or deformity Neuro:  Sensation intact to pinprick on feet Psych: Age appropriate judgment and insight  Assessment:   Type 2 diabetes mellitus with obesity (HCC) - Plan: Comprehensive metabolic panel with GFR, Hemoglobin A1c, Lipid panel  Mixed hyperlipidemia   Plan:   Chronic,  hopefully stable.  Check labs.  Continue doses escalation of Mounjaro .  Continue Farxiga  10 mg daily, metformin  1000 mg twice daily.  Would remove the latter depending on his numbers.  Counseled on diet and exercise. Chronic, hopefully stable.  For now continue pravastatin  20 mg daily, Tricor  48 mg daily. F/u in 3-6 mo pending results. The patient voiced understanding and agreement to the plan.  Noah Taylor [place], DO 04/11/24 7:14 AM

## 2024-04-11 NOTE — Patient Instructions (Addendum)
 Give us  2-3 business days to get the results of your labs back. Our follow up will be based on your results.   Keep the diet clean and stay active.  Very strong work with your weight loss!  If you need more cream for your feet, let me know.   Let us  know if you need anything.

## 2024-04-26 ENCOUNTER — Encounter: Payer: Self-pay | Admitting: Family Medicine

## 2024-04-26 DIAGNOSIS — I10 Essential (primary) hypertension: Secondary | ICD-10-CM

## 2024-04-26 MED ORDER — AMLODIPINE BESYLATE 5 MG PO TABS
5.0000 mg | ORAL_TABLET | Freq: Every day | ORAL | 1 refills | Status: AC
Start: 2024-04-26 — End: ?

## 2024-07-03 ENCOUNTER — Encounter: Payer: Self-pay | Admitting: Family Medicine

## 2024-07-11 ENCOUNTER — Encounter: Admitting: Family Medicine

## 2024-07-18 ENCOUNTER — Telehealth: Payer: Self-pay

## 2024-07-18 ENCOUNTER — Other Ambulatory Visit (HOSPITAL_COMMUNITY): Payer: Self-pay

## 2024-07-18 NOTE — Telephone Encounter (Signed)
 Sent message to PA team to check on medication cost change.

## 2024-07-18 NOTE — Telephone Encounter (Signed)
 Pharmacy Patient Advocate Encounter   Received notification from Patient Advice Request messages that prior authorization for MOUNJARO  is required/requested.   Insurance verification completed.   The patient is insured through [employer].   Per test claim: Refill too soon. PA is not needed at this time. Medication was filled 07/17/24. Next eligible fill date is 08/08/24.

## 2024-07-20 ENCOUNTER — Other Ambulatory Visit: Payer: Self-pay | Admitting: Family Medicine

## 2024-07-20 DIAGNOSIS — E669 Obesity, unspecified: Secondary | ICD-10-CM

## 2024-07-20 MED ORDER — TIRZEPATIDE 15 MG/0.5ML ~~LOC~~ SOAJ: 15.0000 mg | SUBCUTANEOUS | 1 refills | Status: AC

## 2024-08-15 ENCOUNTER — Inpatient Hospital Stay: Admitting: Hematology & Oncology

## 2024-08-15 ENCOUNTER — Encounter: Payer: Self-pay | Admitting: Hematology & Oncology

## 2024-08-15 ENCOUNTER — Inpatient Hospital Stay: Attending: Hematology & Oncology

## 2024-08-15 VITALS — BP 124/84 | HR 80 | Temp 98.4°F | Resp 20 | Ht 68.0 in | Wt 281.8 lb

## 2024-08-15 DIAGNOSIS — Z1721 Progesterone receptor positive status: Secondary | ICD-10-CM | POA: Diagnosis not present

## 2024-08-15 DIAGNOSIS — C50021 Malignant neoplasm of nipple and areola, right male breast: Secondary | ICD-10-CM | POA: Diagnosis present

## 2024-08-15 DIAGNOSIS — C773 Secondary and unspecified malignant neoplasm of axilla and upper limb lymph nodes: Secondary | ICD-10-CM

## 2024-08-15 DIAGNOSIS — C50921 Malignant neoplasm of unspecified site of right male breast: Secondary | ICD-10-CM

## 2024-08-15 DIAGNOSIS — Z171 Estrogen receptor negative status [ER-]: Secondary | ICD-10-CM | POA: Insufficient documentation

## 2024-08-15 DIAGNOSIS — D751 Secondary polycythemia: Secondary | ICD-10-CM | POA: Insufficient documentation

## 2024-08-15 DIAGNOSIS — Z1732 Human epidermal growth factor receptor 2 negative status: Secondary | ICD-10-CM | POA: Insufficient documentation

## 2024-08-15 LAB — CBC WITH DIFFERENTIAL (CANCER CENTER ONLY)
Abs Immature Granulocytes: 0.04 K/uL (ref 0.00–0.07)
Basophils Absolute: 0 K/uL (ref 0.0–0.1)
Basophils Relative: 0 %
Eosinophils Absolute: 0.1 K/uL (ref 0.0–0.5)
Eosinophils Relative: 1 %
HCT: 57.3 % — ABNORMAL HIGH (ref 39.0–52.0)
Hemoglobin: 19.1 g/dL — ABNORMAL HIGH (ref 13.0–17.0)
Immature Granulocytes: 0 %
Lymphocytes Relative: 23 %
Lymphs Abs: 2.3 K/uL (ref 0.7–4.0)
MCH: 26.7 pg (ref 26.0–34.0)
MCHC: 33.3 g/dL (ref 30.0–36.0)
MCV: 80.1 fL (ref 80.0–100.0)
Monocytes Absolute: 1.1 K/uL — ABNORMAL HIGH (ref 0.1–1.0)
Monocytes Relative: 11 %
Neutro Abs: 6.4 K/uL (ref 1.7–7.7)
Neutrophils Relative %: 65 %
Platelet Count: 314 K/uL (ref 150–400)
RBC: 7.15 MIL/uL — ABNORMAL HIGH (ref 4.22–5.81)
RDW: 16.8 % — ABNORMAL HIGH (ref 11.5–15.5)
WBC Count: 9.9 K/uL (ref 4.0–10.5)
nRBC: 0 % (ref 0.0–0.2)

## 2024-08-15 LAB — CMP (CANCER CENTER ONLY)
ALT: 30 U/L (ref 0–44)
AST: 22 U/L (ref 15–41)
Albumin: 4.4 g/dL (ref 3.5–5.0)
Alkaline Phosphatase: 72 U/L (ref 38–126)
Anion gap: 11 (ref 5–15)
BUN: 14 mg/dL (ref 6–20)
CO2: 24 mmol/L (ref 22–32)
Calcium: 9.8 mg/dL (ref 8.9–10.3)
Chloride: 102 mmol/L (ref 98–111)
Creatinine: 1.24 mg/dL (ref 0.61–1.24)
GFR, Estimated: >60 mL/min
Glucose, Bld: 92 mg/dL (ref 70–99)
Potassium: 4.4 mmol/L (ref 3.5–5.1)
Sodium: 138 mmol/L (ref 135–145)
Total Bilirubin: 0.5 mg/dL (ref 0.0–1.2)
Total Protein: 7.5 g/dL (ref 6.5–8.1)

## 2024-08-15 LAB — LACTATE DEHYDROGENASE: LDH: 153 U/L (ref 105–235)

## 2024-08-15 NOTE — Progress Notes (Signed)
 " Hematology and Oncology Follow Up Visit  Noah Taylor [id number] May 12, 1966 59 y.o. 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  We last saw him back in July.  Since then, he has been doing quite well.  Everything is taken care of  down in [place] .  They sold their mom's house.  He they are very happy about this.  He is still working.  He is quite busy at work.  He has lost quite a bit of weight.  Since we last saw him, he has lost about 50 pounds.  He has been on Mounjaro .  He is off metformin  now.  His blood sugars have gotten better.  He is doing well on the Femara .  He has had no problem with hot flashes or sweats.  He has had no arthralgias or myalgias..  He has had no bleeding.  There is no change in bowel or bladder habits.  He has had no leg swelling.  He has had no cough or shortness of breath.  He has had no problems with COVID or Influenza.  Currently, his performance status is probably ECOG 0.  Medications:  Allergies as of 08/15/2024   No Known Allergies      Medication List        Accurate as of August 15, 2024  3:41 PM. If you have any questions, ask your nurse or doctor.          amLODipine  5 MG tablet Commonly known as: NORVASC  Take 1 tablet (5 mg total) by mouth daily.   Farxiga  10 MG Tabs tablet Generic drug: dapagliflozin  propanediol TAKE 1 TABLET DAILY   fenofibrate  48 MG tablet Commonly known as: TRICOR  TAKE 1 TABLET DAILY   letrozole  2.5 MG tablet Commonly known as: FEMARA  TAKE 1 TABLET DAILY   losartan  50 MG tablet Commonly known as: COZAAR  TAKE 1 TABLET  DAILY   pravastatin  20 MG tablet Commonly known as: PRAVACHOL  TAKE 1 TABLET DAILY   tirzepatide  15 MG/0.5ML Pen Commonly known as: MOUNJARO  Inject 15 mg into the skin once a week.        Allergies: No Known Allergies  Past Medical History, Surgical history, Social history, and Family History were reviewed and updated.  Review of Systems: Review of Systems  Constitutional: Negative.   HENT: Negative.    Eyes: Negative.   Respiratory: Negative.    Cardiovascular: Negative.   Gastrointestinal: Negative.   Genitourinary: Negative.   Musculoskeletal: Negative.   Skin: Negative.   Neurological: Negative.   Endo/Heme/Allergies: Negative.   Psychiatric/Behavioral: Negative.       Physical Exam:  height is 5' 8 (1.727 m) and weight is 281 lb 12.8 oz (127.8 kg). His oral temperature is 98.4 F (36.9 C). His blood pressure is 124/84 and his pulse is 80. His respiration is 20 and oxygen saturation is 97%.   Vital signs show temperature of 98.4.  Pulse 80.  Blood pressure 124/84.  Weight is 281 pounds.    Physical Exam Vitals reviewed.  Constitutional:      Comments: Breast exam shows bilateral mastectomies.  He does have some hyperpigmentation  on the right chest wall.  He still has the surgical scar intact.  There is no tenderness.  There is little bit of warmth on the right side.  He has no axillary adenopathy bilaterally.  HENT:     Head: Normocephalic and atraumatic.  Eyes:     Pupils: Pupils are equal, round, and reactive to light.  Cardiovascular:     Rate and Rhythm: Normal rate and regular rhythm.     Heart sounds: Normal heart sounds.  Pulmonary:     Effort: Pulmonary effort is normal.     Breath sounds: Normal breath sounds.  Abdominal:     General: Bowel sounds are normal.     Palpations: Abdomen is soft.  Musculoskeletal:        General: No tenderness or deformity. Normal range of motion.     Cervical back: Normal range of motion.  Lymphadenopathy:      Cervical: No cervical adenopathy.  Skin:    General: Skin is warm and dry.     Findings: No erythema or rash.     Comments: Skin exam shows some hyperpigmentation in the area of his radiation of the right anterior chest wall.  There is no skin breakdown.  Neurological:     Mental Status: He is alert and oriented to person, place, and time.  Psychiatric:        Behavior: Behavior normal.        Thought Content: Thought content normal.        Judgment: Judgment normal.     Lab Results  Component Value Date   WBC 9.9 08/15/2024   HGB 19.1 (H) 08/15/2024   HCT 57.3 (H) 08/15/2024   MCV 80.1 08/15/2024   PLT 314 08/15/2024   No results found for: FERRITIN, IRON, TIBC, UIBC, IRONPCTSAT Lab Results  Component Value Date   RBC 7.15 (H) 08/15/2024   No results found for: KPAFRELGTCHN, LAMBDASER, KAPLAMBRATIO No results found for: IGGSERUM, IGA, IGMSERUM No results found for: STEPHANY CARLOTA BENSON MARKEL EARLA JOANNIE DOC VICK, SPEI   Chemistry      Component Value Date/Time   NA 139 04/11/2024 0714   K 4.4 04/11/2024 0714   CL 103 04/11/2024 0714   CO2 28 04/11/2024 0714   BUN 14 04/11/2024 0714   CREATININE 0.91 04/11/2024 0714   CREATININE 1.16 02/09/2024 1510   CREATININE 0.86 01/06/2024 1111      Component Value Date/Time   CALCIUM 10.2 04/11/2024 0714   ALKPHOS 64 04/11/2024 0714   AST 19 04/11/2024 0714   AST 21 02/09/2024 1510   ALT 35 04/11/2024 0714   ALT 38 02/09/2024 1510   BILITOT 0.8 04/11/2024 0714   BILITOT 0.6 02/09/2024 1510       Impression and Plan: [name] is a very pleasant 59 yo caucasian gentleman with locally advanced infiltrating ductal carcinoma of the right breast - retroareolar, ER+/PR+/HER2-.   He did have neoadjuvant therapy.  He did well with this.  He did have a decent response.  He still had 2/14 positive lymph nodes.  He is on Femara  right now.  So far, he is doing pretty well.   He did not wish to have a CDK4/CDK6 inhibitor.  From my point of view, everything is looking fairly well.  I think the weight loss certainly is going to help him out..  Will plan to get him back in about 4 or 5 months.  I will be troubled by his erythrocytosis.  Given the  fact that he has erythrocytosis but he had a low MCV, this could indicate that he may have a underlying myeloproliferative disorder.  When we see him back, I would do a JAK2 assay on him.    Maude JONELLE Crease, MD 1/21/20263:41 PM  "

## 2024-08-16 LAB — CANCER ANTIGEN 27.29: CA 27.29: 11 U/mL (ref 0.0–38.6)

## 2024-10-12 ENCOUNTER — Ambulatory Visit: Admitting: Family Medicine

## 2025-01-02 ENCOUNTER — Inpatient Hospital Stay

## 2025-01-02 ENCOUNTER — Inpatient Hospital Stay: Admitting: Hematology & Oncology
